# Patient Record
Sex: Female | Born: 1935 | Race: White | Hispanic: No | Smoking: Former smoker
Health system: Southern US, Community
[De-identification: ages and names within clinical notes are randomized; demographics above are authoritative.]

## PROBLEM LIST (undated history)

## (undated) DIAGNOSIS — I4891 Unspecified atrial fibrillation: Secondary | ICD-10-CM

## (undated) DIAGNOSIS — I714 Abdominal aortic aneurysm, without rupture, unspecified: Secondary | ICD-10-CM

## (undated) DIAGNOSIS — H409 Unspecified glaucoma: Secondary | ICD-10-CM

## (undated) DIAGNOSIS — N189 Chronic kidney disease, unspecified: Secondary | ICD-10-CM

## (undated) DIAGNOSIS — M1990 Unspecified osteoarthritis, unspecified site: Secondary | ICD-10-CM

## (undated) DIAGNOSIS — H3530 Unspecified macular degeneration: Secondary | ICD-10-CM

## (undated) DIAGNOSIS — E119 Type 2 diabetes mellitus without complications: Secondary | ICD-10-CM

## (undated) HISTORY — PX: ABDOMINAL AORTIC ANEURYSM REPAIR W/ ENDOLUMINAL GRAFT: SUR7

## (undated) HISTORY — PX: CATARACT EXTRACTION, BILATERAL: SHX1313

## (undated) HISTORY — PX: PLACEMENT OF BREAST IMPLANTS: SHX6334

## (undated) HISTORY — PX: MASTECTOMY: SHX3

## (undated) HISTORY — PX: PARS PLANA VITRECTOMY W/ REPAIR OF MACULAR HOLE: SHX2170

## (undated) HISTORY — PX: BREAST BIOPSY: SHX20

## (undated) HISTORY — PX: DILATION AND CURETTAGE OF UTERUS: SHX78

## (undated) HISTORY — PX: ABDOMINAL AORTA STENT: SHX1108

---

## 2015-01-05 ENCOUNTER — Encounter (HOSPITAL_COMMUNITY): Payer: Self-pay | Admitting: Emergency Medicine

## 2015-01-05 ENCOUNTER — Emergency Department (HOSPITAL_BASED_OUTPATIENT_CLINIC_OR_DEPARTMENT_OTHER): Admit: 2015-01-05 | Discharge: 2015-01-05 | Disposition: A | Discharge status: Home / Self Care | Payer: Medicare Other

## 2015-01-05 ENCOUNTER — Emergency Department (HOSPITAL_COMMUNITY): Payer: Medicare Other

## 2015-01-05 ENCOUNTER — Emergency Department (HOSPITAL_COMMUNITY)
Admission: EM | Admit: 2015-01-05 | Discharge: 2015-01-06 | Disposition: A | Discharge status: Skilled Nursing Facility | Payer: Medicare Other | Attending: Emergency Medicine | Admitting: Emergency Medicine

## 2015-01-05 ENCOUNTER — Other Ambulatory Visit: Payer: Self-pay

## 2015-01-05 DIAGNOSIS — M79609 Pain in unspecified limb: Secondary | ICD-10-CM

## 2015-01-05 DIAGNOSIS — E119 Type 2 diabetes mellitus without complications: Secondary | ICD-10-CM | POA: Insufficient documentation

## 2015-01-05 DIAGNOSIS — N189 Chronic kidney disease, unspecified: Secondary | ICD-10-CM | POA: Diagnosis not present

## 2015-01-05 DIAGNOSIS — I671 Cerebral aneurysm, nonruptured: Secondary | ICD-10-CM | POA: Diagnosis not present

## 2015-01-05 DIAGNOSIS — Z7901 Long term (current) use of anticoagulants: Secondary | ICD-10-CM | POA: Insufficient documentation

## 2015-01-05 DIAGNOSIS — Z8669 Personal history of other diseases of the nervous system and sense organs: Secondary | ICD-10-CM | POA: Diagnosis not present

## 2015-01-05 DIAGNOSIS — M25562 Pain in left knee: Secondary | ICD-10-CM | POA: Diagnosis present

## 2015-01-05 DIAGNOSIS — R531 Weakness: Secondary | ICD-10-CM | POA: Diagnosis not present

## 2015-01-05 DIAGNOSIS — Z79899 Other long term (current) drug therapy: Secondary | ICD-10-CM | POA: Diagnosis not present

## 2015-01-05 DIAGNOSIS — I4891 Unspecified atrial fibrillation: Secondary | ICD-10-CM | POA: Insufficient documentation

## 2015-01-05 HISTORY — DX: Unspecified atrial fibrillation: I48.91

## 2015-01-05 HISTORY — DX: Chronic kidney disease, unspecified: N18.9

## 2015-01-05 LAB — I-STAT TROPONIN, ED: Troponin i, poc: 0.02 ng/mL (ref 0.00–0.08)

## 2015-01-05 LAB — URINALYSIS, ROUTINE W REFLEX MICROSCOPIC
Bilirubin Urine: NEGATIVE
Glucose, UA: NEGATIVE mg/dL
Ketones, ur: NEGATIVE mg/dL
Nitrite: NEGATIVE
Protein, ur: NEGATIVE mg/dL
Specific Gravity, Urine: 1.008 (ref 1.005–1.030)
Urobilinogen, UA: 0.2 mg/dL (ref 0.0–1.0)
pH: 6 (ref 5.0–8.0)

## 2015-01-05 LAB — PROTIME-INR
INR: 1.3 (ref 0.00–1.49)
Prothrombin Time: 16.3 s — ABNORMAL HIGH (ref 11.6–15.2)

## 2015-01-05 LAB — COMPREHENSIVE METABOLIC PANEL WITH GFR
ALT: 15 U/L (ref 14–54)
AST: 26 U/L (ref 15–41)
Albumin: 3 g/dL — ABNORMAL LOW (ref 3.5–5.0)
Alkaline Phosphatase: 117 U/L (ref 38–126)
Anion gap: 8 (ref 5–15)
BUN: 29 mg/dL — ABNORMAL HIGH (ref 6–20)
CO2: 28 mmol/L (ref 22–32)
Calcium: 9 mg/dL (ref 8.9–10.3)
Chloride: 102 mmol/L (ref 101–111)
Creatinine, Ser: 1.05 mg/dL — ABNORMAL HIGH (ref 0.44–1.00)
GFR calc Af Amer: 52 mL/min — ABNORMAL LOW
GFR calc non Af Amer: 45 mL/min — ABNORMAL LOW
Glucose, Bld: 107 mg/dL — ABNORMAL HIGH (ref 65–99)
Potassium: 4 mmol/L (ref 3.5–5.1)
Sodium: 138 mmol/L (ref 135–145)
Total Bilirubin: 0.7 mg/dL (ref 0.3–1.2)
Total Protein: 6.8 g/dL (ref 6.5–8.1)

## 2015-01-05 LAB — DIFFERENTIAL
Basophils Absolute: 0 K/uL (ref 0.0–0.1)
Basophils Relative: 0 % (ref 0–1)
Eosinophils Absolute: 0.1 K/uL (ref 0.0–0.7)
Eosinophils Relative: 2 % (ref 0–5)
Lymphocytes Relative: 23 % (ref 12–46)
Lymphs Abs: 1.4 K/uL (ref 0.7–4.0)
Monocytes Absolute: 0.9 K/uL (ref 0.1–1.0)
Monocytes Relative: 15 % — ABNORMAL HIGH (ref 3–12)
Neutro Abs: 3.7 K/uL (ref 1.7–7.7)
Neutrophils Relative %: 60 % (ref 43–77)

## 2015-01-05 LAB — CBC
HCT: 44 % (ref 36.0–46.0)
Hemoglobin: 14.5 g/dL (ref 12.0–15.0)
MCH: 29.8 pg (ref 26.0–34.0)
MCHC: 33 g/dL (ref 30.0–36.0)
MCV: 90.3 fL (ref 78.0–100.0)
Platelets: 240 K/uL (ref 150–400)
RBC: 4.87 MIL/uL (ref 3.87–5.11)
RDW: 13.5 % (ref 11.5–15.5)
WBC: 6 K/uL (ref 4.0–10.5)

## 2015-01-05 LAB — RAPID URINE DRUG SCREEN, HOSP PERFORMED
Amphetamines: NOT DETECTED
Barbiturates: NOT DETECTED
Benzodiazepines: NOT DETECTED
Cocaine: NOT DETECTED
Opiates: NOT DETECTED
Tetrahydrocannabinol: NOT DETECTED

## 2015-01-05 LAB — ETHANOL: Alcohol, Ethyl (B): <5 mg/dL

## 2015-01-05 LAB — URINE MICROSCOPIC-ADD ON

## 2015-01-05 LAB — APTT: aPTT: 34 s (ref 24–37)

## 2015-01-05 NOTE — ED Provider Notes (Signed)
4:23 PM Patient seen in conjunction with [place] PA-C. Patient from nursing facility with concern for blood clot in L leg after having acute knee pain today during physical therapy. Also her legs have been more swollen than normal as of late. Patient has been refusing wraps and TED hose. She has diagnosis of afib and is on Xarelto. Patient currently does not have any complaints and is wondering why she is here. She is oriented and does not have history of dementia reported. On Lasix, unclear is she had h/o CHF -- denies SOB.   Plan: x-ray knee, LE doppler, ambulate.   Exam:  Gen NAD; Heart irregularly irregular, nml S1,S2, no m/r/g; Lungs CTAB; Abd soft, NT, no rebound or guarding; Ext 2+ pedal pulses bilaterally, 2+ symmetric non-pitting edema to ankles bilaterally; Neuro fully alert and oriented.  7:18 PM Patient discussed with and seen by Dr. Loretha Stapler. Advises work-up for TIA, admit for work-up.   Doppler and x-ray are negative. Patient remains asymptomatic.   10:33 PM CT shows large cerebral aneurysm. I discussed this with neurosurgery, Dr. Jeral Fruit. They advised that this does not require emergent intervention because it is not bleeding. Advised to evaluate carefully for other causes of ischemia and TIA.  I had a long discussion with the patient regarding these findings. She states that she's not really surprised by this because she had an aneurysm of her aorta. When asked if she'd be willing to be admitted to the hospital for consideration of repair, she states "what's the point? It's not like I would do anything for it anyway". She states that she has been waiting 'for the end' for awhile and is comfortable with her mortality. She is very mentally sharp and understands that if she does not have treatment for the aneurysm that she could die. She also states she could potentially be harmed by the treatment for the aneurysm. She states that she is aware that her transient trouble talking and weakness  today could be a warning of a stroke. We discussed the morbidity and mortality that he could be caused by a stroke. I told patient that she can return if she changes her mind.   I attempted to call patient's son Apolonia Ellwood who is POA. I got a voicemail and left a message.   Patient to be discharged. I encouraged her to see her PCP at the facilty this week for further discussion.   BP 133/92 mmHg  Pulse 106  Temp(Src) 97.6 F (36.4 C) (Oral)  Resp 19  SpO2 97%   Renne Crigler, PA-C 01/05/15 2338  Blake Divine, MD 01/08/15 [phone]

## 2015-01-05 NOTE — ED Provider Notes (Signed)
CSN: [id number]     Arrival date & time 01/05/15  1455 History   First MD Initiated Contact with Patient 01/05/15 1520     Chief Complaint  Patient presents with  . Leg Pain     (Consider location/radiation/quality/duration/timing/severity/associated sxs/prior Treatment) HPI Comments: Michele Lin is a 81 y/o F who was sent here from her nursing home for left knee pain upon ambulating with physical therapist this afternoon. According to the nursing home nurse (via telephone), the pt was attempting to use a walker in her physical therapy session when she was unable to move her left leg and complained of severe left knee pain. The physical therapist was concerned for a blood clot and sent her to the ER. The pt states that she did NOT feel any pain, but that she was unable to move her leg temporarily while ambulating. This is unusual for pt as she is normally able to have complete mobility of lower extremities. Pt does not see a reason for ER visit. Pt has lower extremity edema that has been present for over 40 years and is painless. Pt has been receiving LE compression wraps 3x/week and [facility] nursing home staff feels that the edema has worsened since pt has been receiving wraps. Pt has recent diagnosis of "erratic heart beat" and has been recently placed on xeralto. Pt denies history of any other chronic medical conditions. Denies history of known heart failure. Denies new onset SOB, difficulty of speech, pain in extremities, dysphagia, headache, or acute change in vision.  Of note, pt claims that about 2 weeks ago she experienced a transient loss in speech. This lasted only temporarily, for a few hours. Pt was concerned that she may have had a mini stroke. This occurred while pt was in nursing home, was never clinically adressed. Other than this reported incident, no known history of stroke.   Patient is a 81 y.o. female presenting with leg pain. The history is provided by the patient (nurse at  [facility] nursing home by phone).  Leg Pain Associated symptoms: no fatigue and no fever        Past Medical History  Diagnosis Date  . Chronic kidney disease (CKD)   . Atrial fibrillation   . Macular degeneration   . Diabetes mellitus without complication    History reviewed. No pertinent past surgical history. History reviewed. No pertinent family history. History  Substance Use Topics  . Smoking status: Never Smoker   . Smokeless tobacco: Not on file  . Alcohol Use: No   OB History    No data available     Review of Systems  Constitutional: Negative for fever and fatigue.  HENT: Negative for trouble swallowing.   Eyes: Negative for visual disturbance.  Respiratory: Negative for chest tightness, shortness of breath and wheezing.   Cardiovascular: Positive for leg swelling. Negative for chest pain and palpitations.  Musculoskeletal: Positive for gait problem. Negative for joint swelling and arthralgias.  Neurological: Negative for dizziness, speech difficulty, weakness, light-headedness and headaches.  Psychiatric/Behavioral: Negative for confusion.  All other systems reviewed and are negative.     Allergies  Review of patient's allergies indicates no known allergies.  Home Medications   Prior to Admission medications   Medication Sig Start Date End Date Taking? Authorizing Provider  acetaminophen (TYLENOL) 500 MG tablet Take 500 mg by mouth every 4 (four) hours as needed for mild pain, fever or headache.   Yes Historical Provider, MD  alum & mag hydroxide-simeth (GERI-LANTA) 200-200-20  MG/5ML suspension Take 30 mLs by mouth daily as needed for indigestion or heartburn.   Yes Historical Provider, MD  dorzolamide-timolol (COSOPT) 22.3-6.8 MG/ML ophthalmic solution Place 1 drop into the left eye at bedtime.   Yes Historical Provider, MD  furosemide (LASIX) 20 MG tablet Take 20 mg by mouth daily with breakfast.   Yes Historical Provider, MD  guaiFENesin (Q-TUSSIN) 100  MG/5ML liquid Take 200 mg by mouth every 6 (six) hours as needed for cough.   Yes Historical Provider, MD  loperamide (IMODIUM) 2 MG capsule Take 2 mg by mouth as needed for diarrhea or loose stools.   Yes Historical Provider, MD  loratadine (CLARITIN) 10 MG tablet Take 10 mg by mouth daily with breakfast.   Yes Historical Provider, MD  magnesium hydroxide (MILK OF MAGNESIA) 400 MG/5ML suspension Take 30 mLs by mouth at bedtime as needed for mild constipation.   Yes Historical Provider, MD  neomycin-bacitracin-polymyxin (NEOSPORIN) ointment Apply 1 application topically as needed for wound care.   Yes Historical Provider, MD  Nutritional Supplements (NUTRITIONAL SHAKE) LIQD Take 1 Bottle by mouth daily with breakfast. *Mighty Shake*   Yes Historical Provider, MD  polyethylene glycol powder (GLYCOLAX/MIRALAX) powder Take 1 Container by mouth at bedtime.   Yes Historical Provider, MD  potassium chloride SA (K-DUR,KLOR-CON) 20 MEQ tablet Take 10 mEq by mouth daily with breakfast.   Yes Historical Provider, MD  Rivaroxaban (XARELTO) 15 MG TABS tablet Take 15 mg by mouth at bedtime.   Yes Historical Provider, MD  trospium (SANCTURA) 20 MG tablet Take 20 mg by mouth every evening.   Yes Historical Provider, MD   BP 132/97 mmHg  Pulse 89  Temp(Src) 97.6 F (36.4 C) (Oral)  Resp 16  SpO2 100% Physical Exam  Constitutional: She is oriented to person, place, and time. She appears well-developed and well-nourished. No distress.  HENT:  Head: Normocephalic and atraumatic.  Eyes: Conjunctivae and EOM are normal. Pupils are equal, round, and reactive to light. No scleral icterus.  Cardiovascular: Intact distal pulses.  Exam reveals no gallop and no friction rub.   No murmur heard. Irregularly irregular rate and rhythm  Pulmonary/Chest: Effort normal and breath sounds normal. No respiratory distress. She has no wheezes. She has no rales. She exhibits no tenderness.  Abdominal: Soft. She exhibits no  distension.  Musculoskeletal: Normal range of motion. She exhibits edema and tenderness.  Non pitting edema  Neurological: She is alert and oriented to person, place, and time. No cranial nerve deficit.  Strength 5/5 in all 4 extremities. Decrease sensation in feet bilaterally.  Skin: Skin is warm and dry. She is not diaphoretic.  Psychiatric: She has a normal mood and affect. Her behavior is normal.    ED Course  Procedures (including critical care time)  Pt was sent here from nursing home, per nursing home nurse: during physical therapy session pt was unable to ambulate due to severe pain. Physical therapist was concerned for blood clot. Pt also has chronic lower extremity edema. Nursing home staff reports that edema had worsened. LE venous studies as well as knee xray were ordered, results were unremarkable. Negative for blood clot.   Pt states that she did NOT feel pain while ambulating, but that she was transiently unable to move her left leg and experienced brief weakness. Pt also reports that 2 weeks prior she experienced transient loss of speech.   TIA workup was initiated.   CT w/o contrast displayed a 1.8 x 1.1 cm right  MCA bifurcation cerebral aneurysm.   Pt given options for treatment including: clipping/coiling of aneurysm, consult with neurosurgeon. Pt is made aware of risks and benefits of both procedures and of the option to refuse treatment. Pt has chosen to not accept treatment and to follow up out patient with PCP. Pt has full mental capacity.    Labs Review Labs Reviewed  PROTIME-INR - Abnormal; Notable for the following:    Prothrombin Time 16.3 (*)    All other components within normal limits  DIFFERENTIAL - Abnormal; Notable for the following:    Monocytes Relative 15 (*)    All other components within normal limits  ETHANOL  APTT  CBC  COMPREHENSIVE METABOLIC PANEL  URINE RAPID DRUG SCREEN, HOSP PERFORMED  URINALYSIS, ROUTINE W REFLEX MICROSCOPIC (NOT AT  [facility])  I-STAT TROPOININ, ED    Imaging Review Ct Head Wo Contrast  01/05/2015   CLINICAL DATA:  81 year old female with sudden onset of leg pain during physical therapy, worse with weight-bearing.  EXAM: CT HEAD WITHOUT CONTRAST  TECHNIQUE: Contiguous axial images were obtained from the base of the skull through the vertex without intravenous contrast.  COMPARISON:  No priors.  FINDINGS: In the anterior aspect of the right temporal region there is a 1.8 x 1.1 cm high attenuation partially calcified extra-axial lesion which appears most concerning for a right MCA bifurcation aneurysm. This may alternatively represent an extra-axial lesion such as a partially calcified meningioma. Moderate cerebral and cerebellar atrophy. Well-defined foci of low attenuation in the cerebellar hemispheres bilaterally, compatible with old lacunar infarcts. Patchy and confluent areas of decreased attenuation are noted throughout the deep and periventricular white matter of the cerebral hemispheres bilaterally, compatible with chronic microvascular ischemic disease. No acute intracranial hemorrhage, and no hydrocephalus. No definite signs of acute or subacute cerebral ischemia. No acute displaced skull fractures. Visualized paranasal sinuses and mastoids are well pneumatized.  IMPRESSION: 1. No acute intracranial abnormalities. 2. However, there is a large 1.8 x 1.1 cm extra-axial high attenuation partially calcified lesion in the anterior aspect of the right temporal region, favored to represent a large aneurysm, likely of the right MCA bifurcation. Less likely, this might represent a meningioma. 3. Moderate cerebral and cerebellar atrophy with chronic microvascular ischemic changes in cerebral white matter, and old cerebellar lacunar infarcts. These results were called by telephone at the time of interpretation on 01/05/2015 at 7:55 pm to Dr. Gaylyn Rong, who verbally acknowledged these results.   Electronically Signed   By:  Trudie Reed M.D.   On: 01/05/2015 19:59   Dg Knee Complete 4 Views Left  01/05/2015   CLINICAL DATA:  81 year old female with edema in the left leg for the past 40 years, worsening over the past 2 weeks.  EXAM: LEFT KNEE - COMPLETE 4+ VIEW  COMPARISON:  No priors.  FINDINGS: No acute displaced fracture, subluxation or dislocation. There is extensive joint space narrowing, subchondral sclerosis, subchondral cyst formation and osteophyte formation in a tricompartmental distribution, most severe in the medial and patellofemoral compartments, compatible with osteoarthritis. Extensive atherosclerotic calcifications.  IMPRESSION: 1. No acute radiographic abnormality of the left knee. 2. Moderate degenerative changes of tricompartmental osteoarthritis, as above. 3. Atherosclerosis.   Electronically Signed   By: Trudie Reed M.D.   On: 01/05/2015 17:51     EKG Interpretation None      MDM   Final diagnoses:  Weakness  Cerebral aneurysm    Pt admitted for stroke workup given history of two episodes of  presumed transient neurological deficits. One episode occurring 2 weeks ago with reported transient loss of speech that lasted a few hours before returning. The second episode which occurred today prompting visit to ED, brief inability to move left leg during ambulation with physical therapist. This prompts concern for TIA. Pt is anticoagulated on xeralto for recent diagnosis of a.fib. However, pt does have extensive atherosclerosis.   Pt received CT head w/o contrast which showed a 1.1x1.8 cm right MCA bifurcation cerebral aneurysm.   Pt was updated and given options for treatment. Pt made choice to return home with no treatment and to follow up with PCP out patient.     Lester Kinsman McLendon-Chisholm, PA-C 01/06/15 0013  Blake Divine, MD 01/08/15 [phone]

## 2015-01-05 NOTE — Progress Notes (Signed)
CSW met with patient at bedside. There was no family present. Patient confirms that she comes from [facility]. Per note, patient presents to [facility] due to leg pain.  Patient states that she does not fall often. Patient states that she receives assistance with completing ADL's while at facility.  Patient informed CSW that she uses wheelchair to get around.   Patient states her primary support is her son who lives in [place]. She states that he visits her weekly.  Patient informed CSW that she does not have any questions at this time.  Willette Brace [phone] ED CSW 01/05/2015 5:17 PM

## 2015-01-05 NOTE — Progress Notes (Signed)
Left lower extremity venous duplex completed.  Left:  No evidence of DVT, superficial thrombosis, or Baker's cyst.  Right:  Negative for DVT in the common femoral vein.  

## 2015-01-05 NOTE — ED Notes (Signed)
Stoke screen completed,  Pt passed,

## 2015-01-05 NOTE — ED Notes (Signed)
Report received from [facility]  Pt is  Here for leg pain and reported NAD

## 2015-01-05 NOTE — ED Notes (Addendum)
Per EMS. Pt from [facility] nursing home. Pt reports leg edema for past "40 years" that staff report has gotten worse in past 2 weeks. Pt also had sudden onset of leg pain during physical therapy worsened with weight bearing. Normally walks with walker. Pt denies any leg pain. States leg swelling is the same as normal.

## 2015-01-05 NOTE — Discharge Instructions (Signed)
Cerebral Aneurysm An aneurysm is the bulging or ballooning out of part of the weakened wall of a vein or artery. An aneurysm in the vein or artery of the brain is called a brain aneurysm, or cerebral aneurysm.  Aneurysms are a risk to your health because they may leak or rupture. Once the aneurysm leaks or ruptures, bleeding occurs. If the bleeding occurs within the brain tissue, the condition is called an intracerebral hemorrhage. An intracerebral hemorrhage can result in a hemorrhagic stroke. If the bleeding occurs in the area between the brain and the thin tissues that cover the brain, the condition is called a subarachnoid hemorrhage. This increases the pressure on the brain and causes some areas of the brain to not get the necessary blood flow. The blood from the ruptured aneurysm collects and presses on the surrounding brain tissue. A subarachnoid hemorrhage can cause a stroke. A ruptured cerebral aneurysm is a medical emergency. This can cause permanent damage and loss of brain function. CAUSES A cerebral aneurysm is caused when a weakened part of the blood vessel expands. The blood vessel expands due to the constant pressure from the flow of blood through the weakened blood vessel. Usually the aneurysm expands slowly. As the weakened aneurysm expands, the walls of the aneurysm become weaker. Aneurysms may be associated with diseases that weaken and damage the walls of your blood vessels or blood vessels that develop abnormally. Some known causes for cerebral aneurysms are:  Head trauma.  Infection.  Use of "recreational drugs" such as cocaine or amphetamines. RISK FACTORS People at risk for a cerebral aneurysm or hemorrhagic stroke usually have one or more risk factors, which include:  Having high blood pressure (hypertension).  Abusing alcohol.  Having abnormal blood vessels present since birth.  Having certain bleeding disorders, such as hemophilia, sickle cell disease, or liver  disease.  Taking blood thinners (anticoagulants).  Smoking. SIGNS AND SYMPTOMS  The signs and symptoms of an unruptured cerebral aneurysm will partly depend on its size and rate of growth. A small, unchanging aneurysm generally does not produce symptoms. A larger aneurysm that is steadily growing can increase pressure on the brain or nerves. That increased pressure from the unruptured cerebral aneurysm can cause:  A headache.  Problems with your vision.  Numbness or weakness in an arm or leg.  Problems with memory.  Problems speaking.  Seizures. If an aneurysm leaks or bursts, it can cause a stroke and be life-threatening. Symptoms may include:  A sudden, severe headache with no known cause. The headache is often described as the worst headache ever experienced.  Nausea or vomiting, especially when combined with other symptoms such as a headache.  Sudden weakness or numbness of the face, arm, or leg, especially on one side of the body.  Sudden trouble walking or difficulty moving arms or legs.  Sudden confusion.  Sudden personality changes.  Trouble speaking (aphasia) or understanding.  Difficulty swallowing.  Sudden trouble seeing in one or both eyes.  Double vision.  Dizziness.  Loss of balance or coordination.  Intolerance to light.  Stiff neck. DIAGNOSIS  A CTA (computed tomographic angiography) may be performed to diagnose an aneurysm. A CTA uses dye and a CT scanner to take images of your blood vessels. An MRA (magnetic resonance angiography) may be used to diagnose an aneurysm. An MRA is performed in an MRI machine. While in the MRI machine, images of your blood vessels are taken. A cerebral aneurysm may also be diagnosed with a  cerebral angiogram. A cerebral angiogram requires a tube called a catheter to be inserted into a blood vessel and advanced to the blood vessels in your neck. Dye is then injected while X-ray images are taken to show the blood vessels in  your brain. TREATMENT  Unruptured Aneurysms Treatment is complex when an aneurysm is found and it is not causing problems. Treatment is very individualized, as each case is different. Many things must be considered, such as the size and exact location of your aneurysm, your age, your overall health, and your feelings and preferences. Small aneurysms in certain locations of the brain have a very low chance of bleeding or rupturing. These small aneurysms may not be treated. However, depending on the size and location of the aneurysm, treatments may be recommended and include:  Coiling. During this procedure, a catheter is inserted and advanced through a blood vessel. Once the catheter reaches the aneurysm, tiny coils are used to block blood flow into the aneurysm.  Surgical clipping. During surgery, a clip is placed at the base of the aneurysm. The clip prevents blood from continuing to enter the aneurysm. Ruptured Aneurysms Immediate emergency surgery may be needed to help prevent damage to the brain and to reduce the risk of rebleeding. Timing of treatment is an important factor in the prevention of complications. Successful early treatment of a ruptured aneurysm (within the first 3 days of a bleed) helps to prevent rebleeding and blood vessel spasm. In some cases, there may be a reason to treat later (10-14 days after a rupture). Many things are considered when making this decision, and each case is handled individually. HOME CARE INSTRUCTIONS  Take medicines only as instructed by your health care provider.  Eat healthy foods. It is recommended that you eat 5 or more servings of fruits and vegetables each day. Foods may need to be a special consistency (soft or pureed), or small bites may need to be taken if you have had a ruptured aneurysm or stroke. Certain dietary changes may be advised to address high blood pressure, high cholesterol, diabetes, or obesity.  Food choices that are low in salt  (sodium), saturated fat, trans fat, and cholesterol are recommended to manage high blood pressure.  Food choices that are high in fiber and low in saturated fat, trans fat, and cholesterol are recommended to control cholesterol levels.  Controlling carbohydrate and sugar intake is recommended to manage diabetes.  Reducing calorie intake and making food choices that are low in sodium, saturated fat, trans fat, and cholesterol are recommended to manage obesity.  Maintain a healthy weight.  Stay physically active. It is recommended that you get at least 30 minutes of activity on most or all days.  Do not smoke.  Limit alcohol use. Moderate alcohol use is considered to be:  No more than 2 drinks each day for men.  No more than 1 drink each day for nonpregnant women.  Stop drug abuse.  A safe home environment is important to reduce the risk of falls. Your health care provider may arrange for specialists to evaluate your home. Having grab bars in the bedroom and bathroom is often important. Your health care provider may arrange for special equipment to be used at home, such as raised toilets and a seat for the shower.  Physical, occupational, and speech therapy. Ongoing therapy may be needed to maximize your recovery after a ruptured aneurysm or stroke. If you have been advised to use a walker or a cane, use  it at all times. Be sure to keep your therapy appointments.  Follow all instructions for follow-up with your health care provider. This is very important. This includes any referrals, physical therapy, rehabilitation, and laboratory tests. Proper follow-up may prevent an aneurysm rupture or a stroke. SEEK IMMEDIATE MEDICAL CARE IF:  You have a sudden, severe headache with no known cause.  You have sudden nausea or vomiting with a severe headache.  You have sudden weakness or numbness of the face, arm, or leg, especially on one side of the body.  You have sudden trouble walking or  difficulty moving arms or legs.  You have sudden confusion.  You have trouble speaking or understanding.  You have sudden trouble seeing in one or both eyes.  You have a sudden loss of balance or coordination.  You have a stiff neck.  You have difficulty breathing.  You have a partial or total loss of consciousness. Any of these symptoms may represent a serious problem that is an emergency. Do not wait to see if the symptoms will go away. Get medical help at once. Call your local emergency services (911 in [place] Do not drive yourself to the hospital. Document Released: 02/05/2002 Document Revised: 09/30/2013 Document Reviewed: 11/01/2012 [facility] Patient Information 2015 [place], [place]. This information is not intended to replace advice given to you by your health care provider. Make sure you discuss any questions you have with your health care provider.   Follow up closely with PCP to further discuss cerebral aneurysm as well as TIA (mini stroke) symptoms.

## 2015-01-05 NOTE — ED Notes (Signed)
Gave report to [facility] med tech that pt needs to follow up with Bowen MD tomorrow

## 2015-01-06 NOTE — ED Notes (Signed)
[facility] here to pick patient up for transportation back to facility. Assisted with patient using the bedpan with no results. Pt tolerated it well.

## 2015-03-20 ENCOUNTER — Encounter (HOSPITAL_COMMUNITY): Payer: Self-pay

## 2015-03-20 ENCOUNTER — Inpatient Hospital Stay (HOSPITAL_COMMUNITY)
Admission: EM | Admit: 2015-03-20 | Discharge: 2015-03-23 | DRG: 300 | Disposition: A | Discharge status: Nursing Home (Includes ICF) | Payer: Medicare Other | Attending: Internal Medicine | Admitting: Internal Medicine

## 2015-03-20 ENCOUNTER — Emergency Department (HOSPITAL_COMMUNITY): Payer: Medicare Other

## 2015-03-20 DIAGNOSIS — Z87891 Personal history of nicotine dependence: Secondary | ICD-10-CM

## 2015-03-20 DIAGNOSIS — R1031 Right lower quadrant pain: Secondary | ICD-10-CM | POA: Diagnosis not present

## 2015-03-20 DIAGNOSIS — Z6824 Body mass index (BMI) 24.0-24.9, adult: Secondary | ICD-10-CM

## 2015-03-20 DIAGNOSIS — R109 Unspecified abdominal pain: Secondary | ICD-10-CM

## 2015-03-20 DIAGNOSIS — Z66 Do not resuscitate: Secondary | ICD-10-CM | POA: Diagnosis present

## 2015-03-20 DIAGNOSIS — E1122 Type 2 diabetes mellitus with diabetic chronic kidney disease: Secondary | ICD-10-CM | POA: Diagnosis present

## 2015-03-20 DIAGNOSIS — R10A1 Flank pain, right side: Secondary | ICD-10-CM | POA: Insufficient documentation

## 2015-03-20 DIAGNOSIS — R64 Cachexia: Secondary | ICD-10-CM | POA: Diagnosis present

## 2015-03-20 DIAGNOSIS — Z7902 Long term (current) use of antithrombotics/antiplatelets: Secondary | ICD-10-CM

## 2015-03-20 DIAGNOSIS — H3530 Unspecified macular degeneration: Secondary | ICD-10-CM | POA: Diagnosis present

## 2015-03-20 DIAGNOSIS — I714 Abdominal aortic aneurysm, without rupture, unspecified: Secondary | ICD-10-CM | POA: Diagnosis present

## 2015-03-20 DIAGNOSIS — M25551 Pain in right hip: Secondary | ICD-10-CM

## 2015-03-20 DIAGNOSIS — N189 Chronic kidney disease, unspecified: Secondary | ICD-10-CM | POA: Diagnosis present

## 2015-03-20 DIAGNOSIS — Z515 Encounter for palliative care: Secondary | ICD-10-CM | POA: Insufficient documentation

## 2015-03-20 DIAGNOSIS — I4891 Unspecified atrial fibrillation: Secondary | ICD-10-CM | POA: Diagnosis present

## 2015-03-20 DIAGNOSIS — Z79899 Other long term (current) drug therapy: Secondary | ICD-10-CM

## 2015-03-20 DIAGNOSIS — E119 Type 2 diabetes mellitus without complications: Secondary | ICD-10-CM

## 2015-03-20 HISTORY — DX: Type 2 diabetes mellitus without complications: E11.9

## 2015-03-20 HISTORY — DX: Abdominal aortic aneurysm, without rupture, unspecified: I71.40

## 2015-03-20 HISTORY — DX: Unspecified glaucoma: H40.9

## 2015-03-20 HISTORY — DX: Unspecified osteoarthritis, unspecified site: M19.90

## 2015-03-20 HISTORY — DX: Unspecified macular degeneration: H35.30

## 2015-03-20 HISTORY — DX: Abdominal aortic aneurysm, without rupture: I71.4

## 2015-03-20 LAB — URINALYSIS, ROUTINE W REFLEX MICROSCOPIC
Bilirubin Urine: NEGATIVE
GLUCOSE, UA: NEGATIVE mg/dL
Ketones, ur: NEGATIVE mg/dL
Nitrite: NEGATIVE
PROTEIN: NEGATIVE mg/dL
Specific Gravity, Urine: 1.011 (ref 1.005–1.030)
UROBILINOGEN UA: 1 mg/dL (ref 0.0–1.0)
pH: 7 (ref 5.0–8.0)

## 2015-03-20 LAB — CBC WITH DIFFERENTIAL/PLATELET
BASOS ABS: 0 10*3/uL (ref 0.0–0.1)
BASOS PCT: 0 %
Eosinophils Absolute: 0 10*3/uL (ref 0.0–0.7)
Eosinophils Relative: 1 %
HEMATOCRIT: 45.5 % (ref 36.0–46.0)
HEMOGLOBIN: 15 g/dL (ref 12.0–15.0)
Lymphocytes Relative: 20 %
Lymphs Abs: 1.5 10*3/uL (ref 0.7–4.0)
MCH: 30.1 pg (ref 26.0–34.0)
MCHC: 33 g/dL (ref 30.0–36.0)
MCV: 91.2 fL (ref 78.0–100.0)
Monocytes Absolute: 1.1 10*3/uL — ABNORMAL HIGH (ref 0.1–1.0)
Monocytes Relative: 15 %
NEUTROS ABS: 4.9 10*3/uL (ref 1.7–7.7)
NEUTROS PCT: 64 %
Platelets: 207 10*3/uL (ref 150–400)
RBC: 4.99 MIL/uL (ref 3.87–5.11)
RDW: 15 % (ref 11.5–15.5)
WBC: 7.6 10*3/uL (ref 4.0–10.5)

## 2015-03-20 LAB — COMPREHENSIVE METABOLIC PANEL
ALT: 21 U/L (ref 14–54)
ANION GAP: 8 (ref 5–15)
AST: 32 U/L (ref 15–41)
Albumin: 3.1 g/dL — ABNORMAL LOW (ref 3.5–5.0)
Alkaline Phosphatase: 119 U/L (ref 38–126)
BILIRUBIN TOTAL: 1.2 mg/dL (ref 0.3–1.2)
BUN: 28 mg/dL — ABNORMAL HIGH (ref 6–20)
CO2: 31 mmol/L (ref 22–32)
Calcium: 9.2 mg/dL (ref 8.9–10.3)
Chloride: 97 mmol/L — ABNORMAL LOW (ref 101–111)
Creatinine, Ser: 1.24 mg/dL — ABNORMAL HIGH (ref 0.44–1.00)
GFR calc non Af Amer: 37 mL/min — ABNORMAL LOW (ref >60)
GFR, EST AFRICAN AMERICAN: 42 mL/min — AB (ref >60)
Glucose, Bld: 122 mg/dL — ABNORMAL HIGH (ref 65–99)
Potassium: 4.2 mmol/L (ref 3.5–5.1)
SODIUM: 136 mmol/L (ref 135–145)
TOTAL PROTEIN: 6.6 g/dL (ref 6.5–8.1)

## 2015-03-20 LAB — URINE MICROSCOPIC-ADD ON

## 2015-03-20 MED ORDER — ONDANSETRON HCL 4 MG/2ML IJ SOLN
4.0000 mg | Freq: Once | INTRAMUSCULAR | Status: AC
  Administered 2015-03-20: 4 mg via INTRAVENOUS
  Filled 2015-03-20: qty 2

## 2015-03-20 MED ORDER — FENTANYL CITRATE (PF) 100 MCG/2ML IJ SOLN
25.0000 ug | Freq: Once | INTRAMUSCULAR | Status: AC
  Administered 2015-03-20: 25 ug via INTRAVENOUS
  Filled 2015-03-20: qty 2

## 2015-03-20 MED ORDER — SODIUM CHLORIDE 0.9 % IV BOLUS (SEPSIS)
500.0000 mL | Freq: Once | INTRAVENOUS | Status: AC
  Administered 2015-03-20: 500 mL via INTRAVENOUS

## 2015-03-20 NOTE — ED Provider Notes (Signed)
CSN: [id number]     Arrival date & time 03/20/15  1805 History   First MD Initiated Contact with Patient 03/20/15 1824     Chief Complaint  Patient presents with  . Groin Pain     (Consider location/radiation/quality/duration/timing/severity/associated sxs/prior Treatment) Patient is a 81 y.o. female presenting with general illness. The history is provided by the patient.  Illness Location:  R hip Quality:  Aching Severity:  Severe Onset quality:  Gradual Duration:  2 days Timing:  Intermittent Progression:  Waxing and waning Chronicity:  New Context:  Spontaneous, bed bound Relieved by:  Nothing tried Worsened by:  Movement Ineffective treatments:  Nothing tried Associated symptoms: abdominal pain   Associated symptoms: no chest pain, no diarrhea, no fever, no headaches, no loss of consciousness, no nausea, no rash, no shortness of breath and no vomiting   Abdominal pain:    Location:  Periumbilical   Quality:  Pressure and aching   Severity:  Moderate   Onset quality:  Gradual   Duration:  2 days   Timing:  Constant   Progression:  Worsening   Chronicity:  Recurrent Risk factors:  Hx of AAA, Prior surgery   Past Medical History  Diagnosis Date  . Chronic kidney disease (CKD)   . Atrial fibrillation (HCC)   . Glaucoma, left eye   . Macular degeneration, right eye   . Type II diabetes mellitus (HCC)   . Arthritis     "in my joints mostly; fingers getting more crookeder" (03/21/2015)  . AAA (abdominal aortic aneurysm) without rupture [facility]) hospitalized 03/20/2015   Past Surgical History  Procedure Laterality Date  . Abdominal aorta stent  ~2005  . Placement of breast implants Bilateral   . Breast biopsy Bilateral 1970's?  . Mastectomy Bilateral 1970's?    "subcutaneous"  . Cataract extraction, bilateral    . Pars plana vitrectomy w/ repair of macular hole Left   . Dilation and curettage of uterus    . Abdominal aortic aneurysm repair w/ endoluminal graft  ~  2006    Michele Lin 03/21/2015   History reviewed. No pertinent family history. Social History  Substance Use Topics  . Smoking status: Former Smoker -- 1.00 packs/day for 40 years    Types: Cigarettes  . Smokeless tobacco: None     Comment: "quit smoking in ~ 2005"  . Alcohol Use: Yes     Comment: 03/21/2015 "get a 6 pack of beer during football season"   OB History    No data available     Review of Systems  Constitutional: Negative for fever.  HENT: Negative for facial swelling.   Respiratory: Negative for shortness of breath.   Cardiovascular: Negative for chest pain.  Gastrointestinal: Positive for abdominal pain and abdominal distention. Negative for nausea, vomiting, diarrhea, constipation, blood in stool and rectal pain.  Genitourinary: Negative for dysuria.  Musculoskeletal: Negative for back pain.  Skin: Negative for rash.  Neurological: Negative for loss of consciousness and headaches.  Psychiatric/Behavioral: Negative for confusion.      Allergies  Review of patient's allergies indicates no known allergies.  Home Medications   Prior to Admission medications   Medication Sig Start Date End Date Taking? Authorizing Provider  acetaminophen (TYLENOL) 500 MG tablet Take 500 mg by mouth every 4 (four) hours as needed for mild pain, fever or headache.   Yes Historical Provider, MD  alum & mag hydroxide-simeth (GERI-LANTA) 200-200-20 MG/5ML suspension Take 30 mLs by mouth daily as needed for indigestion or  heartburn.   Yes Historical Provider, MD  amoxicillin (AMOXIL) 500 MG capsule Take 500 mg by mouth 3 (three) times daily.   Yes Historical Provider, MD  dorzolamide-timolol (COSOPT) 22.3-6.8 MG/ML ophthalmic solution Place 1 drop into the left eye at bedtime.   Yes Historical Provider, MD  furosemide (LASIX) 20 MG tablet Take 20 mg by mouth daily with breakfast.   Yes Historical Provider, MD  guaiFENesin (Q-TUSSIN) 100 MG/5ML liquid Take 200 mg by mouth every 6 (six)  hours as needed for cough.   Yes Historical Provider, MD  loperamide (IMODIUM) 2 MG capsule Take 2 mg by mouth as needed for diarrhea or loose stools.   Yes Historical Provider, MD  loratadine (CLARITIN) 10 MG tablet Take 10 mg by mouth daily with breakfast.   Yes Historical Provider, MD  magnesium hydroxide (MILK OF MAGNESIA) 400 MG/5ML suspension Take 30 mLs by mouth at bedtime as needed for mild constipation.   Yes Historical Provider, MD  neomycin-bacitracin-polymyxin (NEOSPORIN) ointment Apply 1 application topically as needed for wound care.   Yes Historical Provider, MD  Nutritional Supplements (NUTRITIONAL SHAKE) LIQD Take 1 Bottle by mouth daily with breakfast. *Mighty Shake*   Yes Historical Provider, MD  polyethylene glycol powder (GLYCOLAX/MIRALAX) powder Take 1 Container by mouth daily.    Yes Historical Provider, MD  potassium chloride SA (K-DUR,KLOR-CON) 20 MEQ tablet Take 10 mEq by mouth daily with breakfast.   Yes Historical Provider, MD  Rivaroxaban (XARELTO) 15 MG TABS tablet Take 15 mg by mouth at bedtime.   Yes Historical Provider, MD  trospium (SANCTURA) 20 MG tablet Take 20 mg by mouth every evening.   Yes Historical Provider, MD   BP 141/95 mmHg  Pulse 82  Temp(Src) 97.3 F (36.3 C) (Oral)  Resp 16  SpO2 97% Physical Exam  Constitutional: She is oriented to person, place, and time. She appears well-developed and well-nourished. No distress.  HENT:  Head: Normocephalic and atraumatic.  Right Ear: External ear normal.  Left Ear: External ear normal.  Nose: Nose normal.  Mouth/Throat: Oropharynx is clear and moist. No oropharyngeal exudate.  Eyes: Conjunctivae and EOM are normal. Pupils are equal, round, and reactive to light. Right eye exhibits no discharge. Left eye exhibits no discharge. No scleral icterus.  Neck: Normal range of motion. Neck supple. No JVD present. No tracheal deviation present. No thyromegaly present.  Cardiovascular: Normal rate, regular rhythm  and intact distal pulses.   Pulmonary/Chest: Effort normal and breath sounds normal. No stridor. No respiratory distress. She has no wheezes. She has no rales. She exhibits no tenderness.  Abdominal: Soft. She exhibits pulsatile midline mass and mass. She exhibits no distension, no fluid wave and no ascites. There is no tenderness.  Musculoskeletal: Normal range of motion. She exhibits no edema or tenderness.  Lymphadenopathy:    She has no cervical adenopathy.  Neurological: She is alert and oriented to person, place, and time.  Skin: Skin is warm and dry. No rash noted. She is not diaphoretic. No erythema. No pallor.  Psychiatric: She has a normal mood and affect. Her behavior is normal. Judgment and thought content normal.  Nursing note and vitals reviewed.   ED Course  Procedures (including critical care time) Labs Review Labs Reviewed  COMPREHENSIVE METABOLIC PANEL - Abnormal; Notable for the following:    Chloride 97 (*)    Glucose, Bld 122 (*)    BUN 28 (*)    Creatinine, Ser 1.24 (*)    Albumin 3.1 (*)  GFR calc non Af Amer 37 (*)    GFR calc Af Amer 42 (*)    All other components within normal limits  CBC WITH DIFFERENTIAL/PLATELET - Abnormal; Notable for the following:    Monocytes Absolute 1.1 (*)    All other components within normal limits  URINALYSIS, ROUTINE W REFLEX MICROSCOPIC (NOT AT [facility]) - Abnormal; Notable for the following:    APPearance HAZY (*)    Hgb urine dipstick SMALL (*)    Leukocytes, UA TRACE (*)    All other components within normal limits  URINE MICROSCOPIC-ADD ON - Abnormal; Notable for the following:    Squamous Epithelial / LPF FEW (*)    All other components within normal limits  GLUCOSE, CAPILLARY - Abnormal; Notable for the following:    Glucose-Capillary 103 (*)    All other components within normal limits  BASIC METABOLIC PANEL  CBC    Imaging Review Ct Renal Stone Study  03/20/2015  CLINICAL DATA:  Acute right groin pain. EXAM:  CT ABDOMEN AND PELVIS WITHOUT CONTRAST TECHNIQUE: Multidetector CT imaging of the abdomen and pelvis was performed following the standard protocol without IV contrast. COMPARISON:  None. FINDINGS: Visualized lung bases are unremarkable. No significant osseous abnormality is noted. No gallstones are noted. No focal abnormality is noted in the liver or spleen. Pancreas is not well visualized. Adrenal glands are not well visualized. Multiple large cysts are seen involving the kidneys. The largest measures 9 cm in diameter on the left. The patient has undergone stent graft repair of abdominal aortic aneurysm. However this is failed as the proximal end of the stent is well within the aneurysmal sac, which now has maximum measured diameter of 8.8 cm. There is no evidence of bowel obstruction. The appendix appears normal. No abnormal fluid collection is noted. Urinary bladder appears normal. Calcified uterine fibroid is noted. Sigmoid diverticulosis is noted without inflammation. 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 No significant adenopathy is noted. IMPRESSION: Large bilateral renal cysts are noted. Sigmoid diverticulosis is noted without inflammation. Failed stent graft procedure for treatment of abdominal aortic aneurysm. Aneurysmal sac is severely enlarged, with maximum measured diameter of 8.8 cm. Vascular surgery consultation recommended due to increased risk of rupture for AAA >5.5 cm. This recommendation follows ACR consensus guidelines: White Paper of the ACR Incidental Findings Committee II on Vascular Findings. J Am Coll Radiol 2013; 10:789-794. Critical Value/emergent results were called by telephone at the time of interpretation on 03/20/2015 at 8:12 pm to Dr. Gavin Pound , who verbally acknowledged these results. Electronically Signed   By: Lupita Raider, M.D.   On: 03/20/2015 20:13   Dg Femur, Min 2 Views Right  03/20/2015  CLINICAL DATA:  Right hip pain 2 days.  No injury.  EXAM: RIGHT FEMUR 2 VIEWS COMPARISON:  None. FINDINGS: There mild degenerate changes of the right hip. There is no acute fracture or dislocation. There is moderate calcified atherosclerotic disease involving iliac, femoral and popliteal arteries. IMPRESSION: No acute findings. Electronically Signed   By: Elberta Fortis M.D.   On: 03/20/2015 20:29   I have personally reviewed and evaluated these images and lab results as part of my medical decision-making.    MDM   Final diagnoses:  Right flank pain  Right hip pain  AAA (abdominal aortic aneurysm) without rupture [facility])    Patient has a history of prior AAA. She's had worsening swelling in her abdomen for 2 days with increased sensation  of a mass. Also some right hip pain, reproduced with palpation and internal/external rotation. Patient presentation concerning for worsening AAA. Laboratory workup and CT imaging obtained, along with plain films of the right femur.  CT imaging concerning for progressing aortic aneurysm. No obvious extravasation, but scan limited by lack of IV contrast due to renal insufficiency. Patient's pain improved in the emergency department with IV medications. Patient would not be a good surgical candidate due to her age and comorbidities. She has considered palliative management should she progress and develop pain or other symptoms associated with progressing aneurysm. Patient's son was contacted who is the patient's POA. He states that the aneurysm has been nearly this large for the last year. I explained that it did appear to have progressed significantly recently with the history provided by his mother. He will be at the hospital in the morning to discuss goals of care further but is in agreement with current level of care and focus on comfort management if needed. Patient's pain was well controlled with IV fentanyl.   Patient and family updated on plan care.  Patient was discussed with hospitalists for further management and  will be admitted.  Vascular surgery was consult a nonemergent basis for evaluation of the patient in the morning. As patient would likely not be an operative candidate, this is more for the purpose of prognostication and to provide additional information as needed for patient and family in planning goals of care.   Patient care was discussed with my attending, Dr. Rhunette Croft.    Gavin Pound, MD 03/21/15 1610  Derwood Kaplan, MD 03/23/15 [phone]

## 2015-03-20 NOTE — ED Notes (Signed)
EMS VS: BP-118/64, HR-64 afib (hx of afib), RR-16, O2-97% RA. CBG-198

## 2015-03-20 NOTE — ED Notes (Signed)
Pt placed on fracture bedpan due to pain in hip area. Call bell given for when pt is finished.

## 2015-03-20 NOTE — ED Notes (Signed)
PER EMS: pt from [facility], here for right side groin pain ongoing for about 24 hours. No difficulty urinating, no recent fall. Pt A&OX4. Denies any other symptoms.

## 2015-03-21 ENCOUNTER — Encounter (HOSPITAL_COMMUNITY): Payer: Self-pay | Admitting: General Practice

## 2015-03-21 DIAGNOSIS — R1030 Lower abdominal pain, unspecified: Secondary | ICD-10-CM | POA: Diagnosis present

## 2015-03-21 DIAGNOSIS — F0390 Unspecified dementia without behavioral disturbance: Secondary | ICD-10-CM | POA: Diagnosis not present

## 2015-03-21 DIAGNOSIS — H3530 Unspecified macular degeneration: Secondary | ICD-10-CM | POA: Diagnosis present

## 2015-03-21 DIAGNOSIS — E119 Type 2 diabetes mellitus without complications: Secondary | ICD-10-CM | POA: Diagnosis not present

## 2015-03-21 DIAGNOSIS — I714 Abdominal aortic aneurysm, without rupture, unspecified: Secondary | ICD-10-CM | POA: Insufficient documentation

## 2015-03-21 DIAGNOSIS — N183 Chronic kidney disease, stage 3 (moderate): Secondary | ICD-10-CM | POA: Diagnosis not present

## 2015-03-21 DIAGNOSIS — Z79899 Other long term (current) drug therapy: Secondary | ICD-10-CM | POA: Diagnosis not present

## 2015-03-21 DIAGNOSIS — I4891 Unspecified atrial fibrillation: Secondary | ICD-10-CM | POA: Diagnosis not present

## 2015-03-21 DIAGNOSIS — Z6824 Body mass index (BMI) 24.0-24.9, adult: Secondary | ICD-10-CM | POA: Diagnosis not present

## 2015-03-21 DIAGNOSIS — N189 Chronic kidney disease, unspecified: Secondary | ICD-10-CM | POA: Diagnosis not present

## 2015-03-21 DIAGNOSIS — Z66 Do not resuscitate: Secondary | ICD-10-CM | POA: Diagnosis present

## 2015-03-21 DIAGNOSIS — M25551 Pain in right hip: Secondary | ICD-10-CM

## 2015-03-21 DIAGNOSIS — Z515 Encounter for palliative care: Secondary | ICD-10-CM | POA: Diagnosis present

## 2015-03-21 DIAGNOSIS — R1031 Right lower quadrant pain: Secondary | ICD-10-CM | POA: Diagnosis present

## 2015-03-21 DIAGNOSIS — I480 Paroxysmal atrial fibrillation: Secondary | ICD-10-CM

## 2015-03-21 DIAGNOSIS — R109 Unspecified abdominal pain: Secondary | ICD-10-CM | POA: Diagnosis not present

## 2015-03-21 DIAGNOSIS — Z87891 Personal history of nicotine dependence: Secondary | ICD-10-CM | POA: Diagnosis not present

## 2015-03-21 DIAGNOSIS — Z7902 Long term (current) use of antithrombotics/antiplatelets: Secondary | ICD-10-CM | POA: Diagnosis not present

## 2015-03-21 DIAGNOSIS — M1990 Unspecified osteoarthritis, unspecified site: Secondary | ICD-10-CM | POA: Diagnosis not present

## 2015-03-21 DIAGNOSIS — E1122 Type 2 diabetes mellitus with diabetic chronic kidney disease: Secondary | ICD-10-CM | POA: Diagnosis present

## 2015-03-21 DIAGNOSIS — R64 Cachexia: Secondary | ICD-10-CM | POA: Diagnosis present

## 2015-03-21 DIAGNOSIS — H409 Unspecified glaucoma: Secondary | ICD-10-CM | POA: Diagnosis not present

## 2015-03-21 LAB — CBC
HEMATOCRIT: 44.3 % (ref 36.0–46.0)
HEMOGLOBIN: 14.7 g/dL (ref 12.0–15.0)
MCH: 30.4 pg (ref 26.0–34.0)
MCHC: 33.2 g/dL (ref 30.0–36.0)
MCV: 91.7 fL (ref 78.0–100.0)
Platelets: 165 10*3/uL (ref 150–400)
RBC: 4.83 MIL/uL (ref 3.87–5.11)
RDW: 15.1 % (ref 11.5–15.5)
WBC: 8.7 10*3/uL (ref 4.0–10.5)

## 2015-03-21 LAB — BASIC METABOLIC PANEL
ANION GAP: 13 (ref 5–15)
BUN: 28 mg/dL — ABNORMAL HIGH (ref 6–20)
CHLORIDE: 101 mmol/L (ref 101–111)
CO2: 27 mmol/L (ref 22–32)
CREATININE: 1.21 mg/dL — AB (ref 0.44–1.00)
Calcium: 9.3 mg/dL (ref 8.9–10.3)
GFR calc Af Amer: 44 mL/min — ABNORMAL LOW (ref >60)
GFR calc non Af Amer: 38 mL/min — ABNORMAL LOW (ref >60)
Glucose, Bld: 99 mg/dL (ref 65–99)
POTASSIUM: 4.3 mmol/L (ref 3.5–5.1)
Sodium: 141 mmol/L (ref 135–145)

## 2015-03-21 LAB — GLUCOSE, CAPILLARY
GLUCOSE-CAPILLARY: 93 mg/dL (ref 65–99)
GLUCOSE-CAPILLARY: 111 mg/dL — AB (ref 65–99)
GLUCOSE-CAPILLARY: 110 mg/dL — AB (ref 65–99)
GLUCOSE-CAPILLARY: 120 mg/dL — AB (ref 65–99)
Glucose-Capillary: 103 mg/dL — ABNORMAL HIGH (ref 65–99)

## 2015-03-21 LAB — MRSA PCR SCREENING: MRSA by PCR: POSITIVE — AB

## 2015-03-21 MED ORDER — INSULIN ASPART 100 UNIT/ML ~~LOC~~ SOLN
0.0000 [IU] | Freq: Three times a day (TID) | SUBCUTANEOUS | Status: DC
  Administered 2015-03-22: 2 [IU] via SUBCUTANEOUS

## 2015-03-21 MED ORDER — ACETAMINOPHEN 325 MG PO TABS: 650.0000 mg | ORAL_TABLET | Freq: Four times a day (QID) | ORAL | Status: DC | PRN

## 2015-03-21 MED ORDER — OXYCODONE HCL 5 MG PO TABS
5.0000 mg | ORAL_TABLET | ORAL | Status: DC | PRN
  Administered 2015-03-21: 5 mg via ORAL
  Filled 2015-03-21 (×2): qty 1

## 2015-03-21 MED ORDER — LORATADINE 10 MG PO TABS
10.0000 mg | ORAL_TABLET | Freq: Every day | ORAL | Status: DC
  Administered 2015-03-21 – 2015-03-23 (×3): 10 mg via ORAL
  Filled 2015-03-21 (×3): qty 1

## 2015-03-21 MED ORDER — SODIUM CHLORIDE 0.9 % IV SOLN
INTRAVENOUS | Status: DC
  Administered 2015-03-21 – 2015-03-22 (×3): via INTRAVENOUS

## 2015-03-21 MED ORDER — MUPIROCIN 2 % EX OINT
1.0000 "application " | TOPICAL_OINTMENT | Freq: Two times a day (BID) | CUTANEOUS | Status: DC
  Administered 2015-03-21 – 2015-03-23 (×5): 1 via NASAL
  Filled 2015-03-21 (×2): qty 22

## 2015-03-21 MED ORDER — ALUM & MAG HYDROXIDE-SIMETH 200-200-20 MG/5ML PO SUSP: 30.0000 mL | Freq: Four times a day (QID) | ORAL | Status: DC | PRN

## 2015-03-21 MED ORDER — ONDANSETRON HCL 4 MG/2ML IJ SOLN: 4.0000 mg | Freq: Four times a day (QID) | INTRAMUSCULAR | Status: DC | PRN

## 2015-03-21 MED ORDER — METOPROLOL TARTRATE 1 MG/ML IV SOLN
2.5000 mg | Freq: Four times a day (QID) | INTRAVENOUS | Status: DC
  Administered 2015-03-21 – 2015-03-23 (×9): 2.5 mg via INTRAVENOUS
  Filled 2015-03-21 (×9): qty 5

## 2015-03-21 MED ORDER — POTASSIUM CHLORIDE CRYS ER 10 MEQ PO TBCR
10.0000 meq | EXTENDED_RELEASE_TABLET | Freq: Every day | ORAL | Status: DC
  Administered 2015-03-21 – 2015-03-23 (×3): 10 meq via ORAL
  Filled 2015-03-21 (×3): qty 1

## 2015-03-21 MED ORDER — INSULIN ASPART 100 UNIT/ML ~~LOC~~ SOLN: 0.0000 [IU] | Freq: Every day | SUBCUTANEOUS | Status: DC

## 2015-03-21 MED ORDER — ACETAMINOPHEN 650 MG RE SUPP: 650.0000 mg | Freq: Four times a day (QID) | RECTAL | Status: DC | PRN

## 2015-03-21 MED ORDER — CHLORHEXIDINE GLUCONATE CLOTH 2 % EX PADS
6.0000 | MEDICATED_PAD | Freq: Every day | CUTANEOUS | Status: DC
  Administered 2015-03-21 – 2015-03-23 (×3): 6 via TOPICAL

## 2015-03-21 MED ORDER — HYDROMORPHONE HCL 1 MG/ML IJ SOLN: 0.5000 mg | INTRAMUSCULAR | Status: DC | PRN

## 2015-03-21 MED ORDER — AMOXICILLIN 500 MG PO CAPS
500.0000 mg | ORAL_CAPSULE | Freq: Three times a day (TID) | ORAL | Status: DC
  Administered 2015-03-21 – 2015-03-22 (×5): 500 mg via ORAL
  Filled 2015-03-21 (×8): qty 1

## 2015-03-21 MED ORDER — ENSURE ENLIVE PO LIQD
237.0000 mL | Freq: Every day | ORAL | Status: DC
  Administered 2015-03-21 – 2015-03-23 (×3): 237 mL via ORAL

## 2015-03-21 MED ORDER — POLYETHYLENE GLYCOL 3350 17 G PO PACK
17.0000 g | PACK | Freq: Every day | ORAL | Status: DC
  Administered 2015-03-22 – 2015-03-23 (×2): 17 g via ORAL
  Filled 2015-03-21 (×3): qty 1

## 2015-03-21 MED ORDER — ONDANSETRON HCL 4 MG PO TABS: 4.0000 mg | ORAL_TABLET | Freq: Four times a day (QID) | ORAL | Status: DC | PRN

## 2015-03-21 MED ORDER — DORZOLAMIDE HCL-TIMOLOL MAL 2-0.5 % OP SOLN
1.0000 [drp] | Freq: Every day | OPHTHALMIC | Status: DC
  Administered 2015-03-21 – 2015-03-22 (×3): 1 [drp] via OPHTHALMIC
  Filled 2015-03-21: qty 10

## 2015-03-21 MED ORDER — FUROSEMIDE 20 MG PO TABS
20.0000 mg | ORAL_TABLET | Freq: Every day | ORAL | Status: DC
  Administered 2015-03-21 – 2015-03-23 (×3): 20 mg via ORAL
  Filled 2015-03-21 (×3): qty 1

## 2015-03-21 MED ORDER — RIVAROXABAN 15 MG PO TABS
15.0000 mg | ORAL_TABLET | Freq: Every day | ORAL | Status: DC
  Administered 2015-03-21 – 2015-03-22 (×3): 15 mg via ORAL
  Filled 2015-03-21 (×3): qty 1

## 2015-03-21 MED ORDER — DARIFENACIN HYDROBROMIDE ER 7.5 MG PO TB24
7.5000 mg | ORAL_TABLET | Freq: Every day | ORAL | Status: DC
  Administered 2015-03-21 – 2015-03-23 (×3): 7.5 mg via ORAL
  Filled 2015-03-21 (×3): qty 1

## 2015-03-21 NOTE — Consult Note (Signed)
Consultation Note Date: 03/21/2015   Patient Name: Michele Lin  DOB: 21-Sep-1920  MRN: [id number]  Age / Sex: 81 y.o., female  PCP: Ron ParkerSamuel Bowen, MD Referring Physician: Joseph ArtJessica U Vann, DO  Reason for Consultation: Establishing goals of care    Clinical Assessment/Narrative: Pt is a 81 yo female admitted with severe right groin pain. Pt has a h/o AA with graft 10 years ago. After admission CT scan showed non seal of stent graft with enlarging AAA now greater than 8 cm. Pt is not a vascular surgical candidate. Pt is aware of dx, recognizes that she could have an acute event at any time that would end her life. She seems calm about this, " I'm ready". More worried about being a burden to her son. She has been currently residing in ALF, but does not view this as her home.   Contacts/Participants in Discussion: Primary Decision Maker: Pt in conjunction with her son Relationship to Patient son HCPOA: yes  Pt has 2 sons but one son [place] in [place] and [place]  Lives locally and has HCPOA  SUMMARY OF RECOMMENDATIONS DNR Open to hospice Hopeful for hospice in-patient but discussion with son pending for final disposition I do not feel that ALF level of care appropriate as rupture highly likely and pt will need emergency plan in place as well as medications which cannot be provided at that level  Code Status/Advance Care Planning: DNR    Code Status Orders        Start     Ordered   03/21/15 0100  Do not attempt resuscitation (DNR)   Continuous    Question Answer Comment  In the event of cardiac or respiratory ARREST Do not call a "code blue"   In the event of cardiac or respiratory ARREST Do not perform Intubation, CPR, defibrillation or ACLS   In the event of cardiac or respiratory ARREST Use medication by any route, position, wound care, and other measures to relive pain and suffering. May use  oxygen, suction and manual treatment of airway obstruction as needed for comfort.      03/21/15 0059      Other Directives:Advanced Directive  Symptom Management:   Pain: Cont with dilaudid 0.5-1mg  prn. Pt comfortable at rest but monitor for need for scheduled dosing. Insert foley catheter for comfort. DC PT  Palliative Prophylaxis:   Bowel regimen  Additional Recommendations (Limitations, Scope, Preferences):  Informally looking into whether pt meets in-pt hospice criteria as an option to discus with son and pt  Psycho-social/Spiritual:  Support System: Fair Desire for further Chaplaincy support:no Additional Recommendations: Caregiving  Support/Resources  Prognosis: Likely just weeks. Pt is verbalizing before acute pain , intermittent pain for months now associated with functional weakness in that she is now using a WC for mobility, DOE.   Discharge Planning: Pending. Feel that pt is open to hospice but  waiting to discuss specifics with son as well as whether pt may meet in-pt criteria. Feel strongly that this is the best place for pt since WHEN this ruptures she will need immediate palliative sedation as well as pain medications via injectable route   Chief Complaint/ Primary Diagnoses: Present on Admission:  . AAA (abdominal aortic aneurysm) without rupture (HCC) . Atrial fibrillation (HCC) . Chronic kidney disease (CKD) . Macular degeneration  I have reviewed the medical record, interviewed the patient and family, and examined the patient. The following aspects are pertinent.  Past Medical History  Diagnosis Date  . Chronic  kidney disease (CKD)   . Atrial fibrillation (HCC)   . Glaucoma, left eye   . Macular degeneration, right eye   . Type II diabetes mellitus (HCC)   . Arthritis     "in my joints mostly; fingers getting more crookeder" (03/21/2015)  . AAA (abdominal aortic aneurysm) without rupture [facility]) hospitalized 03/20/2015   Social History   Social  History  . Marital Status: Widowed    Spouse Name: N/A  . Number of Children: N/A  . Years of Education: N/A   Social History Main Topics  . Smoking status: Former Smoker -- 1.00 packs/day for 40 years    Types: Cigarettes  . Smokeless tobacco: None     Comment: "quit smoking in ~ 2005"  . Alcohol Use: Yes     Comment: 03/21/2015 "get a 6 pack of beer during football season"  . Drug Use: No  . Sexual Activity: No   Other Topics Concern  . None   Social History Narrative   History reviewed. No pertinent family history. Scheduled Meds: . amoxicillin  500 mg Oral TID  . Chlorhexidine Gluconate Cloth  6 each Topical Q0600  . darifenacin  7.5 mg Oral Daily  . dorzolamide-timolol  1 drop Left Eye QHS  . feeding supplement (ENSURE ENLIVE)  237 mL Oral Q breakfast  . furosemide  20 mg Oral Q breakfast  . insulin aspart  0-5 Units Subcutaneous QHS  . insulin aspart  0-9 Units Subcutaneous TID WC  . loratadine  10 mg Oral Q breakfast  . metoprolol  2.5 mg Intravenous 4 times per day  . mupirocin ointment  1 application Nasal BID  . polyethylene glycol  17 g Oral Daily  . potassium chloride SA  10 mEq Oral Q breakfast  . Rivaroxaban  15 mg Oral QHS   Continuous Infusions: . sodium chloride 50 mL/hr at 03/21/15 0100   PRN Meds:.acetaminophen **OR** acetaminophen, alum & mag hydroxide-simeth, HYDROmorphone (DILAUDID) injection, ondansetron **OR** ondansetron (ZOFRAN) IV, oxyCODONE Medications Prior to Admission:  Prior to Admission medications   Medication Sig Start Date End Date Taking? Authorizing Provider  acetaminophen (TYLENOL) 500 MG tablet Take 500 mg by mouth every 4 (four) hours as needed for mild pain, fever or headache.   Yes Historical Provider, MD  alum & mag hydroxide-simeth (GERI-LANTA) 200-200-20 MG/5ML suspension Take 30 mLs by mouth daily as needed for indigestion or heartburn.   Yes Historical Provider, MD  amoxicillin (AMOXIL) 500 MG capsule Take 500 mg by mouth  3 (three) times daily.   Yes Historical Provider, MD  dorzolamide-timolol (COSOPT) 22.3-6.8 MG/ML ophthalmic solution Place 1 drop into the left eye at bedtime.   Yes Historical Provider, MD  furosemide (LASIX) 20 MG tablet Take 20 mg by mouth daily with breakfast.   Yes Historical Provider, MD  guaiFENesin (Q-TUSSIN) 100 MG/5ML liquid Take 200 mg by mouth every 6 (six) hours as needed for cough.   Yes Historical Provider, MD  loperamide (IMODIUM) 2 MG capsule Take 2 mg by mouth as needed for diarrhea or loose stools.   Yes Historical Provider, MD  loratadine (CLARITIN) 10 MG tablet Take 10 mg by mouth daily with breakfast.   Yes Historical Provider, MD  magnesium hydroxide (MILK OF MAGNESIA) 400 MG/5ML suspension Take 30 mLs by mouth at bedtime as needed for mild constipation.   Yes Historical Provider, MD  neomycin-bacitracin-polymyxin (NEOSPORIN) ointment Apply 1 application topically as needed for wound care.   Yes Historical Provider, MD  Nutritional  Supplements (NUTRITIONAL SHAKE) LIQD Take 1 Bottle by mouth daily with breakfast. *Mighty Shake*   Yes Historical Provider, MD  polyethylene glycol powder (GLYCOLAX/MIRALAX) powder Take 1 Container by mouth daily.    Yes Historical Provider, MD  potassium chloride SA (K-DUR,KLOR-CON) 20 MEQ tablet Take 10 mEq by mouth daily with breakfast.   Yes Historical Provider, MD  Rivaroxaban (XARELTO) 15 MG TABS tablet Take 15 mg by mouth at bedtime.   Yes Historical Provider, MD  trospium (SANCTURA) 20 MG tablet Take 20 mg by mouth every evening.   Yes Historical Provider, MD   No Known Allergies CBC:    Component Value Date/Time   WBC 8.7 03/21/2015 0346   HGB 14.7 03/21/2015 0346   HCT 44.3 03/21/2015 0346   PLT 165 03/21/2015 0346   MCV 91.7 03/21/2015 0346   NEUTROABS 4.9 03/20/2015 1900   LYMPHSABS 1.5 03/20/2015 1900   MONOABS 1.1* 03/20/2015 1900   EOSABS 0.0 03/20/2015 1900   BASOSABS 0.0 03/20/2015 1900   Comprehensive Metabolic  Panel:    Component Value Date/Time   NA 141 03/21/2015 0346   K 4.3 03/21/2015 0346   CL 101 03/21/2015 0346   CO2 27 03/21/2015 0346   BUN 28* 03/21/2015 0346   CREATININE 1.21* 03/21/2015 0346   GLUCOSE 99 03/21/2015 0346   CALCIUM 9.3 03/21/2015 0346   AST 32 03/20/2015 1900   ALT 21 03/20/2015 1900   ALKPHOS 119 03/20/2015 1900   BILITOT 1.2 03/20/2015 1900   PROT 6.6 03/20/2015 1900   ALBUMIN 3.1* 03/20/2015 1900    Review of Systems  Constitutional: Positive for activity change and fatigue.  HENT: Positive for hearing loss.   Respiratory: Positive for shortness of breath.   Gastrointestinal: Positive for constipation.  Genitourinary: Positive for frequency.  Musculoskeletal: Positive for gait problem.  Neurological: Positive for weakness.    Physical Exam  Constitutional: She is oriented to person, place, and time. She appears well-developed.  HENT:  Head: Normocephalic.  Cardiovascular:  Palpable AAA, bounding  Respiratory: Effort normal.  GI: She exhibits distension.  Genitourinary:  Severe right groin pain  Neurological: She is alert and oriented to person, place, and time.  Skin: Skin is warm and dry.  Psychiatric: She has a normal mood and affect.    Vital Signs: BP 126/108 mmHg  Pulse 86  Temp(Src) 97.9 F (36.6 C) (Oral)  Resp 18  Ht 5\' 6"  (1.676 m)  Wt 67.8 kg (149 lb 7.6 oz)  BMI 24.14 kg/m2  SpO2 97% SpO2: Last BM Date: 03/20/15  O2 Device:SpO2: 97 % O2 Flow Rate: .  Intake/output summary:  Intake/Output Summary (Last 24 hours) at 03/21/15 1727 Last data filed at 03/21/15 1303  Gross per 24 hour  Intake   1444 ml  Output    600 ml  Net    844 ml   LBM:  BMP Latest Ref Rng 03/21/2015 03/20/2015 01/05/2015  Glucose 65 - 99 mg/dL 99 811(B) 147(W)  BUN 6 - 20 mg/dL 29(F) 62(Z) 30(Q)  Creatinine 0.44 - 1.00 mg/dL 6.57(Q) 4.69(G) 2.95(M)  Sodium 135 - 145 mmol/L 141 136 138  Potassium 3.5 - 5.1 mmol/L 4.3 4.2 4.0  Chloride 101 - 111  mmol/L 101 97(L) 102  CO2 22 - 32 mmol/L 27 31 28   Calcium 8.9 - 10.3 mg/dL 9.3 9.2 9.0    Baseline Weight: Weight: 67.8 kg (149 lb 7.6 oz) Most recent weight: Weight: 67.8 kg (149 lb 7.6 oz)  Palliative Assessment/Data:  Flowsheet Rows        Most Recent Value   Intake Tab    Referral Department  Hospitalist   Unit at Time of Referral  Cardiac/Telemetry Unit   Palliative Care Primary Diagnosis  Cardiac   Date Notified  03/20/15   Palliative Care Type  New Palliative care   Reason for referral  Clarify Goals of Care   Date of Admission  03/20/15   Date first seen by Palliative Care  03/21/15   # of days Palliative referral response time  1 Day(s)   # of days IP prior to Palliative referral  0   Clinical Assessment    Palliative Performance Scale Score  30%   Pain Max last 24 hours  8   Pain Min Last 24 hours  4   Dyspnea Max Last 24 Hours  1   Dyspnea Min Last 24 hours  1   Nausea Max Last 24 Hours  0   Nausea Min Last 24 Hours  0   Anxiety Max Last 24 Hours  0   Anxiety Min Last 24 Hours  0   Other Max Last 24 Hours  0   Psychosocial & Spiritual Assessment    Palliative Care Outcomes    Patient/Family meeting held?  No   Palliative Care Outcomes  Provided psychosocial or spiritual support, Counseled regarding hospice   Palliative Care follow-up planned  No   Other Treatment Preference Instructions  -- [hospice in pt]      Additional Data Reviewed: Recent Labs     03/20/15  1900  03/21/15  0346  WBC  7.6  8.7  HGB  15.0  14.7  PLT  207  165  NA  136  141  BUN  28*  28*  CREATININE  1.24*  1.21*    Time In: 1600 Time Out: 1710 Time Total: 70 min Greater than 50%  of this time was spent counseling and coordinating care related to the above assessment and plan.  Signed by: Irean Hong, NP  Irean Hong, NP  03/21/2015, 5:27 PM  Please contact Palliative Medicine Team phone at [phone] for questions and concerns.

## 2015-03-21 NOTE — H&P (Signed)
[facility] Admission History and Physical       Michele LordsMary Lin NWG:[id number] DOB: 19-Nov-1920 DOA: 03/20/2015  Referring physician: EDP PCP: Michele ParkerBOWEN,SAMUEL, MD  Specialists:   Chief Complaint: Right Groin Pain   HPI: Michele Lin is a 81 y.o. female with Atrial Fibrillation on Xarelto Rx, DM2, AAA, and CKD who presents to the ED with complaints of 10/10 Right Sided Groin pain which was abrupt in onset this evening.  She denies any history of falls, or trauma.   A CT scan was performed and revealed enlargement of her AAA and failure of the AAA Graft Repair which [facility].    The AAA now measures 8.8 cm in diameter.   Vascular Surgery was consulted to see her in the AM.   She is hemodynamically stable and her pain improved after administration of IV Fentanyl.  She was referred for medical admission.      Review of Systems:  Constitutional: No Weight Loss, No Weight Gain, Night Sweats, Fevers, Chills, Dizziness, Light Headedness, Fatigue, or Generalized Weakness HEENT: No Headaches, Difficulty Swallowing,Tooth/Dental Problems,Sore Throat,  No Sneezing, Rhinitis, Ear Ache, Nasal Congestion, or Post Nasal Drip,  Cardio-vascular:  No Chest pain, Orthopnea, PND, Edema in Lower Extremities, Anasarca, Dizziness, Palpitations  Resp: No Dyspnea, No DOE, No Productive Cough, No Non-Productive Cough, No Hemoptysis, No Wheezing.    GI: No Heartburn, Indigestion, +Abdominal Pain, Nausea, Vomiting, Diarrhea, Constipation, Hematemesis, Hematochezia, Melena, Change in Bowel Habits,  Loss of Appetite  GU: No Dysuria, No Change in Color of Urine, No Urgency or Urinary Frequency, No Flank pain.  Musculoskeletal: No Joint Pain or Swelling, No Decreased Range of Motion, No Back Pain.  Neurologic: No Syncope, No Seizures, Muscle Weakness, Paresthesia, Vision Disturbance or Loss, No Diplopia, No Vertigo, No Difficulty Walking,  Skin: No Rash or Lesions. Psych: No Change in Mood or  Affect, No Depression or Anxiety, No Memory loss, No Confusion, or Hallucinations   Past Medical History  Diagnosis Date  . Chronic kidney disease (CKD)   . Atrial fibrillation (HCC)   . Macular degeneration   . Diabetes mellitus without complication (HCC)      History reviewed. No pertinent past surgical history.    Prior to Admission medications   Medication Sig Start Date End Date Taking? Authorizing Provider  acetaminophen (TYLENOL) 500 MG tablet Take 500 mg by mouth every 4 (four) hours as needed for mild pain, fever or headache.   Yes Historical Provider, MD  alum & mag hydroxide-simeth (GERI-LANTA) 200-200-20 MG/5ML suspension Take 30 mLs by mouth daily as needed for indigestion or heartburn.   Yes Historical Provider, MD  amoxicillin (AMOXIL) 500 MG capsule Take 500 mg by mouth 3 (three) times daily.   Yes Historical Provider, MD  dorzolamide-timolol (COSOPT) 22.3-6.8 MG/ML ophthalmic solution Place 1 drop into the left eye at bedtime.   Yes Historical Provider, MD  furosemide (LASIX) 20 MG tablet Take 20 mg by mouth daily with breakfast.   Yes Historical Provider, MD  guaiFENesin (Q-TUSSIN) 100 MG/5ML liquid Take 200 mg by mouth every 6 (six) hours as needed for cough.   Yes Historical Provider, MD  loperamide (IMODIUM) 2 MG capsule Take 2 mg by mouth as needed for diarrhea or loose stools.   Yes Historical Provider, MD  loratadine (CLARITIN) 10 MG tablet Take 10 mg by mouth daily with breakfast.   Yes Historical Provider, MD  magnesium hydroxide (MILK OF MAGNESIA) 400 MG/5ML suspension Take 30 mLs by  mouth at bedtime as needed for mild constipation.   Yes Historical Provider, MD  neomycin-bacitracin-polymyxin (NEOSPORIN) ointment Apply 1 application topically as needed for wound care.   Yes Historical Provider, MD  Nutritional Supplements (NUTRITIONAL SHAKE) LIQD Take 1 Bottle by mouth daily with breakfast. *Mighty Shake*   Yes Historical Provider, MD  polyethylene glycol powder  (GLYCOLAX/MIRALAX) powder Take 1 Container by mouth daily.    Yes Historical Provider, MD  potassium chloride SA (K-DUR,KLOR-CON) 20 MEQ tablet Take 10 mEq by mouth daily with breakfast.   Yes Historical Provider, MD  Rivaroxaban (XARELTO) 15 MG TABS tablet Take 15 mg by mouth at bedtime.   Yes Historical Provider, MD  trospium (SANCTURA) 20 MG tablet Take 20 mg by mouth every evening.   Yes Historical Provider, MD     No Known Allergies    Social History:  [facility] at the [facility]  reports that she has never smoked. She does not have any smokeless tobacco history on file. She reports that she does not drink alcohol or use illicit drugs.     No family history on file.     Physical Exam:  GEN:  Pleasant Elderly Cachectic 81 y.o. Caucasian female examined and in no acute distress; cooperative with exam Filed Vitals:   03/20/15 2241 03/20/15 2245 03/20/15 2330 03/21/15 0000  BP: 136/101 125/80 112/81 105/87  Pulse: 100 86 60 66  Temp:      TempSrc:      Resp: SpO2: 94% 94% 94% 95%   Blood pressure 105/87, pulse 66, temperature 97.3 F (36.3 C), temperature source Oral, resp. rate 22, SpO2 95 %. PSYCH: She is alert and oriented x4; does not appear anxious does not appear depressed; affect is normal HEENT: Normocephalic and Atraumatic, Mucous membranes pink; PERRLA; EOM intact; Fundi:  Benign;  No scleral icterus, Nares: Patent, Oropharynx: Clear, Edentulous with Dentures,    Neck:  FROM, No Cervical Lymphadenopathy nor Thyromegaly or Carotid Bruit; No JVD; Breasts:: Not examined CHEST WALL: No tenderness CHEST: Normal respiration, clear to auscultation bilaterally HEART: Regular rate and rhythm; no murmurs rubs or gallops BACK: No kyphosis or scoliosis; No CVA tenderness ABDOMEN: Positive Bowel Sounds, Scaphoid, Soft Non-Tender, No Rebound or Guarding; No Masses, No Organomegaly Rectal Exam: Not done EXTREMITIES: No Cyanosis, Clubbing, or  Edema; No Ulcerations. Genitalia: not examined PULSES: 2+ and symmetric SKIN: Normal hydration no rash or ulceration CNS:  Alert and Oriented x 4, No Focal Deficits Vascular: pulses palpable throughout    Labs on Admission:  Basic Metabolic Panel:  Recent Labs Lab 03/20/15 1900  NA 136  K 4.2  CL 97*  CO2 31  GLUCOSE 122*  BUN 28*  CREATININE 1.24*  CALCIUM 9.2   Liver Function Tests:  Recent Labs Lab 03/20/15 1900  AST 32  ALT 21  ALKPHOS 119  BILITOT 1.2  PROT 6.6  ALBUMIN 3.1*   No results for input(s): LIPASE, AMYLASE in the last 168 hours. No results for input(s): AMMONIA in the last 168 hours. CBC:  Recent Labs Lab 03/20/15 1900  WBC 7.6  NEUTROABS 4.9  HGB 15.0  HCT 45.5  MCV 91.2  PLT 207   Cardiac Enzymes: No results for input(s): CKTOTAL, CKMB, CKMBINDEX, TROPONINI in the last 168 hours.  BNP (last 3 results) No results for input(s): BNP in the last 8760 hours.  ProBNP (last 3 results) No results for input(s): PROBNP in the last 8760 hours.  CBG: No results for input(s): GLUCAP in the last 168 hours.  Radiological Exams on Admission: Ct Renal Stone Study  03/20/2015  CLINICAL DATA:  Acute right groin pain. EXAM: CT ABDOMEN AND PELVIS WITHOUT CONTRAST TECHNIQUE: Multidetector CT imaging of the abdomen and pelvis was performed following the standard protocol without IV contrast. COMPARISON:  None. FINDINGS: Visualized lung bases are unremarkable. No significant osseous abnormality is noted. No gallstones are noted. No focal abnormality is noted in the liver or spleen. Pancreas is not well visualized. Adrenal glands are not well visualized. Multiple large cysts are seen involving the kidneys. The largest measures 9 cm in diameter on the left. The patient has undergone stent graft repair of abdominal aortic aneurysm. However this is failed as the proximal end of the stent is well within the aneurysmal sac, which now has maximum measured diameter  of 8.8 cm. There is no evidence of bowel obstruction. The appendix appears normal. No abnormal fluid collection is noted. Urinary bladder appears normal. Calcified uterine fibroid is noted. Sigmoid diverticulosis is noted without inflammation. 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 No significant adenopathy is noted. IMPRESSION: Large bilateral renal cysts are noted. Sigmoid diverticulosis is noted without inflammation. Failed stent graft procedure for treatment of abdominal aortic aneurysm. Aneurysmal sac is severely enlarged, with maximum measured diameter of 8.8 cm. Vascular surgery consultation recommended due to increased risk of rupture for AAA >5.5 cm. This recommendation follows ACR consensus guidelines: White Paper of the ACR Incidental Findings Committee II on Vascular Findings. J Am Coll Radiol 2013; 10:789-794. Critical Value/emergent results were called by telephone at the time of interpretation on 03/20/2015 at 8:12 pm to Dr. Gavin Pound , who verbally acknowledged these results. Electronically Signed   By: Lupita Raider, M.D.   On: 03/20/2015 20:13   Dg Femur, Min 2 Views Right  03/20/2015  CLINICAL DATA:  Right hip pain 2 days.  No injury. EXAM: RIGHT FEMUR 2 VIEWS COMPARISON:  None. FINDINGS: There mild degenerate changes of the right hip. There is no acute fracture or dislocation. There is moderate calcified atherosclerotic disease involving iliac, femoral and popliteal arteries. IMPRESSION: No acute findings. Electronically Signed   By: Elberta Fortis M.D.   On: 03/20/2015 20:29     EKG: Independently reviewed.         Assessment/Plan:      81 y.o. female with  Principal Problem:   1.     AAA (abdominal aortic aneurysm) without rupture [facility])   Non-surgical   Vascular Surgery to see in AM    Active Problems:   2.     Atrial fibrillation (HCC)   On Xarelto Rx     3.    Diabetes mellitus without complication (HCC)   SSI coverage PRN   Check  HbA1C     4.    Chronic kidney disease (CKD)   Monitor BUN/Cr     5.    Macular degeneration   Continue Cosopt      6.    DVT Prophylaxis   On Xarelto Rx    Patient is not in Extremis at this time     Consider Discontinuation of Xarelto due to #1     7.    Other - Palliative Care Consultation in AM     Code Status:     DO NOT RESUSCITATE (DNR)      Family Communication:  No Family Present, EDP spoke with Son over Telephone  who will call if questions overnight, otherwise he will be here in the AM Disposition Plan:    Inpatient Status        Time spent:  69 Minutes      Michele Lin [facility] Pager [phone]   If 7AM -7PM Please Contact the Day Rounding Team MD for [facility]  If 7PM-7AM, Please Contact Night-Floor Coverage  [email or web address] Password [email or web address] 03/21/2015, 12:24 AM     ADDENDUM:   Patient was seen and examined on 03/21/2015

## 2015-03-21 NOTE — Evaluation (Signed)
Physical Therapy Evaluation Patient Details Name: Michele Lin MRN: [id number] DOB: 06-Jan-1921 Today's Date: 03/21/2015   History of Present Illness  Pt is a 81 y/o F who presents for evaluation of AAA after being admitted w/ groin pain.  CT showed non seal of stent graft and enlargement of aneurysm.  Pt not candidate for operation.  Pt's PMH includes a-fib, DM.    Clinical Impression  Pt admitted with above diagnosis. Pt currently with functional limitations due to the deficits listed below (see PT Problem List). Michele Lin required min +1>mod +2 assist for bed mobility and mod +2 assist for sit<>stand.  She was limited by anxiety and pain today.  [facility] but will need SNF level of care at d/c.  Pt will benefit from skilled PT to increase their independence and safety with mobility to allow discharge to the venue listed below.      Follow Up Recommendations SNF;Supervision/Assistance - 24 hour    Equipment Recommendations  Other (comment) (TBD)    Recommendations for Other Services       Precautions / Restrictions Precautions Precautions: Fall Precaution Comments: no strenuous acitivty advised as pt has enlarged aneurysm that is not to be operated on Restrictions Weight Bearing Restrictions: No      Mobility  Bed Mobility Overal bed mobility: Needs Assistance;+2 for physical assistance Bed Mobility: Supine to Sit;Sit to Supine     Supine to sit: Min assist;HOB elevated Sit to supine: Mod assist;+2 for physical assistance   General bed mobility comments: Min assist +1 managing Bil LEs to achieve sitting EOB.  Mod +2 assist to return to supine in bed managing Bil LEs and trunk posteriorly.  Transfers Overall transfer level: Needs assistance Equipment used: Rolling walker (2 wheeled) Transfers: Sit to/from Stand Sit to Stand: Mod assist;+2 physical assistance;+2 safety/equipment;From elevated surface         General  transfer comment: Sit<>stand x2 w/ mod +2 assist to power up to standing and to stabilize once standing. Cues for hand placement.  Both times pt c/o of muscle spasms in Bil LEs as well as fear of Bil knee buckling and becomes extremely anxious and decides to return to sitting EOB.  Ambulation/Gait                Stairs            Wheelchair Mobility    Modified Rankin (Stroke Patients Only)       Balance Overall balance assessment: Needs assistance Sitting-balance support: Bilateral upper extremity supported;Feet supported Sitting balance-Leahy Scale: Fair     Standing balance support: Bilateral upper extremity supported;During functional activity Standing balance-Leahy Scale: Zero                               Pertinent Vitals/Pain Pain Assessment: Faces Faces Pain Scale: Hurts worst Pain Location: Lt groin and muscle spasms in Bil LEs upon standing Pain Descriptors / Indicators: Spasm;Sharp;Grimacing;Guarding;Moaning Pain Intervention(s): Limited activity within patient's tolerance;Monitored during session;Repositioned    Home Living Family/patient expects to be discharged to:: Unsure ("they are talking about taking me to [facility]")                 Additional Comments: Pt is from [facility] but will need SNF level of care at d/c.    Prior Function Level of Independence: Needs assistance   Gait / Transfers Assistance  Needed: Per pt she was nonambulatory for the past 1-2 months 2/2 generalized weakness and has been using her WC only which she operates w/ Bil LEs.  ADL's / Homemaking Assistance Needed: Needs assist w/ cleaning, cooking, bathing, dressing        Hand Dominance        Extremity/Trunk Assessment   Upper Extremity Assessment: Generalized weakness           Lower Extremity Assessment: Generalized weakness         Communication   Communication: HOH  Cognition Arousal/Alertness:  Awake/alert Behavior During Therapy: Anxious Overall Cognitive Status: No family/caregiver present to determine baseline cognitive functioning                      General Comments      Exercises        Assessment/Plan    PT Assessment Patient needs continued PT services  PT Diagnosis Difficulty walking;Abnormality of gait;Acute pain;Generalized weakness   PT Problem List Decreased strength;Decreased range of motion;Decreased activity tolerance;Decreased balance;Decreased mobility;Decreased coordination;Decreased cognition;Decreased knowledge of use of DME;Decreased safety awareness;Decreased knowledge of precautions;Pain;Cardiopulmonary status limiting activity  PT Treatment Interventions DME instruction;Gait training;Functional mobility training;Therapeutic activities;Therapeutic exercise;Balance training;Neuromuscular re-education;Cognitive remediation;Patient/family education;Modalities;Wheelchair mobility training   PT Goals (Current goals can be found in the Care Plan section) Acute Rehab PT Goals Patient Stated Goal: to feel better PT Goal Formulation: With patient Time For Goal Achievement: 04/11/15 Potential to Achieve Goals: Fair    Frequency Min 2X/week   Barriers to discharge Decreased caregiver support Pt will not have 24/7 assist upon d/c    Co-evaluation               End of Session Equipment Utilized During Treatment: Gait belt Activity Tolerance: Other (comment);Patient limited by fatigue;Patient limited by pain (limited by pt's anxiety w/ sit<>stand) Patient left: in bed;with call bell/phone within reach;with bed alarm set Nurse Communication: Mobility status;Precautions (pt's report of muscle spasms in quads)         Time: 1345-1406 PT Time Calculation (min) (ACUTE ONLY): 21 min   Charges:   PT Evaluation $Initial PT Evaluation Tier I: 1 Procedure     PT G CodesMichail Jewels PT, DPT [phone] Pager: [phone] 03/21/2015,  3:51 PM

## 2015-03-21 NOTE — Progress Notes (Signed)
Patient admitted after midnight, please H&P.  Await vascular surgery consult.  Pain better contolled  Marlin CanaryJessica Lida Berkery DO

## 2015-03-21 NOTE — Consult Note (Signed)
[facility] [place] HISTORY AND PHYSICAL   History of Present Illness:  Patient is a 81 y.o. year old female who presents for evaluation of AAA.  Pt had endovascular repair of AAA in [facility] by Dr Sherrell Puller approximately 10 years ago.  She was admitted to hospital last night for groin pain.  She had a CT which showed non seal of stent graft and enlargement of aneurysm.  Pt currently has no abdominal pain or back pain.  Other medical problems include afib, diabetes, arthritis all of which are stable.  Pt has not been seen in [facility] for "years".  Past Medical History  Diagnosis Date  . Chronic kidney disease (CKD)   . Atrial fibrillation (HCC)   . Glaucoma, left eye   . Macular degeneration, right eye   . Type II diabetes mellitus (HCC)   . Arthritis     "in my joints mostly; fingers getting more crookeder" (03/21/2015)  . AAA (abdominal aortic aneurysm) without rupture [facility]) hospitalized 03/20/2015    Past Surgical History  Procedure Laterality Date  . Abdominal aorta stent  ~2005  . Placement of breast implants Bilateral   . Breast biopsy Bilateral 1970's?  . Mastectomy Bilateral 1970's?    "subcutaneous"  . Cataract extraction, bilateral    . Pars plana vitrectomy w/ repair of macular hole Left   . Dilation and curettage of uterus    . Abdominal aortic aneurysm repair w/ endoluminal graft  ~ 2006    Hattie Perch 03/21/2015    Social History Social History  Substance Use Topics  . Smoking status: Former Smoker -- 1.00 packs/day for 40 years    Types: Cigarettes  . Smokeless tobacco: None     Comment: "quit smoking in ~ 2005"  . Alcohol Use: Yes     Comment: 03/21/2015 "get a 6 pack of beer during football season"    Family History History reviewed. No pertinent family history.  Allergies  No Known Allergies   Current Facility-Administered Medications  Medication Dose Route Frequency Provider Last Rate Last Dose  . 0.9 %  sodium chloride  infusion   Intravenous Continuous Ron Parker, MD 50 mL/hr at 03/21/15 0100    . acetaminophen (TYLENOL) tablet 650 mg  650 mg Oral Q6H PRN Ron Parker, MD       Or  . acetaminophen (TYLENOL) suppository 650 mg  650 mg Rectal Q6H PRN Ron Parker, MD      . alum & mag hydroxide-simeth (MAALOX/MYLANTA) 200-200-20 MG/5ML suspension 30 mL  30 mL Oral Q6H PRN Ron Parker, MD      . amoxicillin (AMOXIL) capsule 500 mg  500 mg Oral TID Ron Parker, MD   500 mg at 03/21/15 0248  . Chlorhexidine Gluconate Cloth 2 % PADS 6 each  6 each Topical Q0600 Joseph Art, DO      . darifenacin (ENABLEX) 24 hr tablet 7.5 mg  7.5 mg Oral Daily Harvette Velora Heckler, MD      . dorzolamide-timolol (COSOPT) 22.3-6.8 MG/ML ophthalmic solution 1 drop  1 drop Left Eye QHS Ron Parker, MD   1 drop at 03/21/15 0248  . feeding supplement (ENSURE ENLIVE) (ENSURE ENLIVE) liquid 237 mL  237 mL Oral Q breakfast Harvette C Jenkins, MD      . furosemide (LASIX) tablet 20 mg  20 mg Oral Q breakfast Harvette C Jenkins, MD      . HYDROmorphone (DILAUDID) injection 0.5-1 mg  0.5-1 mg Intravenous Q3H PRN Ron ParkerHarvette C Jenkins, MD      . insulin aspart (novoLOG) injection 0-5 Units  0-5 Units Subcutaneous QHS Ron ParkerHarvette C Jenkins, MD   0 Units at 03/21/15 0100  . insulin aspart (novoLOG) injection 0-9 Units  0-9 Units Subcutaneous TID WC Harvette Velora Heckler Jenkins, MD      . loratadine (CLARITIN) tablet 10 mg  10 mg Oral Q breakfast Ron ParkerHarvette C Jenkins, MD      . metoprolol (LOPRESSOR) injection 2.5 mg  2.5 mg Intravenous 4 times per day Ron ParkerHarvette C Jenkins, MD   2.5 mg at 03/21/15 0603  . mupirocin ointment (BACTROBAN) 2 % 1 application  1 application Nasal BID Jessica U Vann, DO      . ondansetron (ZOFRAN) tablet 4 mg  4 mg Oral Q6H PRN Ron ParkerHarvette C Jenkins, MD       Or  . ondansetron (ZOFRAN) injection 4 mg  4 mg Intravenous Q6H PRN Ron ParkerHarvette C Jenkins, MD      . oxyCODONE (Oxy IR/ROXICODONE) immediate release  tablet 5 mg  5 mg Oral Q4H PRN Harvette Velora Heckler Jenkins, MD      . polyethylene glycol (MIRALAX / GLYCOLAX) packet 17 g  17 g Oral Daily Harvette Velora Heckler Jenkins, MD      . potassium chloride (K-DUR,KLOR-CON) CR tablet 10 mEq  10 mEq Oral Q breakfast Ron ParkerHarvette C Jenkins, MD      . Rivaroxaban (XARELTO) tablet 15 mg  15 mg Oral QHS Ron ParkerHarvette C Jenkins, MD   15 mg at 03/21/15 0239    ROS:   General:  No weight loss, Fever, chills  HEENT: No recent headaches, no nasal bleeding, no visual changes, no sore throat  Neurologic: No dizziness, blackouts, seizures. No recent symptoms of stroke or mini- stroke. No recent episodes of slurred speech, or temporary blindness.  Cardiac: No recent episodes of chest pain/pressure, no shortness of breath at rest.  + shortness of breath with exertion.  Denies history of atrial fibrillation or irregular heartbeat  Vascular: No history of rest pain in feet.  No history of claudication.  No history of non-healing ulcer, No history of DVT   Pulmonary: No home oxygen, no productive cough, no hemoptysis,  No asthma or wheezing  Musculoskeletal:  [x ] Arthritis, [ ]  Low back pain,  [ ]  Joint pain  Hematologic:No history of hypercoagulable state.  No history of easy bleeding.  No history of anemia  Gastrointestinal: No hematochezia or melena,  No gastroesophageal reflux, no trouble swallowing  Urinary: [ ]  chronic Kidney disease, [ ]  on HD - [ ]  MWF or [ ]  TTHS, [ ]  Burning with urination, [ ]  Frequent urination, [ ]  Difficulty urinating;   Skin: No rashes  Psychological: No history of anxiety,  No history of depression   Physical Examination  Filed Vitals:   03/21/15 0000 03/21/15 0022 03/21/15 0524 03/21/15 0558  BP: 105/87 122/82 115/80 117/83  Pulse: 66 148 77 86  Temp:   97.9 F (36.6 C)   TempSrc:   Oral   Resp: 22 23 18    Height:   5\' 6"  (1.676 m)   Weight:   149 lb 7.6 oz (67.8 kg)   SpO2: 95%  98% 97%    Body mass index is 24.14 kg/(m^2).  General:   Alert and oriented, no acute distress HEENT: Normal Neck: No JVD Pulmonary: Clear to auscultation bilaterally Cardiac: Regular Rate and Rhythm  Abdomen: Soft, non-tender, non-distended, large pulsatile epigastric mass  Skin: No rash Extremity Pulses:  2+  Femoral bilaterally Musculoskeletal: No deformity or edema  Neurologic: Upper and lower extremity motor 5/5 and symmetric  DATA:  CT abdomen reviewed.  No proximal seal on aneurysm stent.  Enlarged neck and paravisceral aorta.  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  Pt not candidate for operation as this would not be fixable with stent graft and would require open thoracoabdominal aneurysm repair for which she is not a candidate due to her advanced age.   PLAN:  Risk of rupture > 50% per year.  No intervention or follow up planned.  Pt understands the aneurysm may rupture and lead to her death and is accepting of this.  Fabienne Bruns, MD [facility] [place] Office: [phone] Pager: [phone]

## 2015-03-21 NOTE — ED Notes (Signed)
Dr. Jenkins bedside. 

## 2015-03-22 DIAGNOSIS — I714 Abdominal aortic aneurysm, without rupture: Secondary | ICD-10-CM | POA: Diagnosis not present

## 2015-03-22 DIAGNOSIS — Z66 Do not resuscitate: Secondary | ICD-10-CM | POA: Diagnosis not present

## 2015-03-22 DIAGNOSIS — E119 Type 2 diabetes mellitus without complications: Secondary | ICD-10-CM

## 2015-03-22 DIAGNOSIS — H3530 Unspecified macular degeneration: Secondary | ICD-10-CM

## 2015-03-22 LAB — GLUCOSE, CAPILLARY
GLUCOSE-CAPILLARY: 100 mg/dL — AB (ref 65–99)
Glucose-Capillary: 84 mg/dL (ref 65–99)
Glucose-Capillary: 161 mg/dL — ABNORMAL HIGH (ref 65–99)
Glucose-Capillary: 101 mg/dL — ABNORMAL HIGH (ref 65–99)

## 2015-03-22 NOTE — Progress Notes (Addendum)
PROGRESS NOTE  Michele Lin ZOX:[id number] DOB: 1920/07/23 DOA: 03/20/2015 PCP: Ron Parker, MD  Michele Lin is a 81 y.o. female with Atrial Fibrillation on Xarelto Rx, DM2, AAA, and CKD who presents to the ED with complaints of 10/10 Right Sided Groin pain which was abrupt in onset.   A CT scan was performed and revealed enlargement of her AAA and failure of the AAA Graft Repair which was done 10 years ago at [facility]. The AAA now measures 8.8 cm in diameter. No intervention by vascular, palliative care consult for dispo.   Assessment/Plan: AAA (abdominal aortic aneurysm) without rupture (HCC) Non-surgical Vascular Surgery: Risk of rupture > 50% per year. No intervention or follow up planned   Hospice consult  Atrial fibrillation (HCC) On Xarelto Rx  Diabetes mellitus without complication (HCC) SSI coverage PRN  Chronic kidney disease (CKD) Monitor BUN/Cr  Macular degeneration Continue Cosopt    Code Status: DNR Family Communication: patient Disposition Plan:    Consultants:  Vascular  palliative  Procedures:      HPI/Subjective: No SOB, no CP Does not feel like eating but can drink ensure  Objective: Filed Vitals:   03/22/15 0626  BP: 102/67  Pulse: 82  Temp: 98 F (36.7 C)  Resp:     Intake/Output Summary (Last 24 hours) at 03/22/15 0911 Last data filed at 03/22/15 0559  Gross per 24 hour  Intake   1322 ml  Output    900 ml  Net    422 ml   Filed Weights   03/21/15 0524  Weight: 67.8 kg (149 lb 7.6 oz)    Exam:   General:  Awake, NAD  Cardiovascular: rrr  Respiratory: clear  Abdomen: +BS, soft  Musculoskeletal: min edema with chronic skin changes  Data Reviewed: Basic Metabolic Panel:  Recent Labs Lab 03/20/15 1900 03/21/15 0346  NA 136 141  K 4.2 4.3  CL  97* 101  CO2 31 27  GLUCOSE 122* 99  BUN 28* 28*  CREATININE 1.24* 1.21*  CALCIUM 9.2 9.3   Liver Function Tests:  Recent Labs Lab 03/20/15 1900  AST 32  ALT 21  ALKPHOS 119  BILITOT 1.2  PROT 6.6  ALBUMIN 3.1*   No results for input(s): LIPASE, AMYLASE in the last 168 hours. No results for input(s): AMMONIA in the last 168 hours. CBC:  Recent Labs Lab 03/20/15 1900 03/21/15 0346  WBC 7.6 8.7  NEUTROABS 4.9  --   HGB 15.0 14.7  HCT 45.5 44.3  MCV 91.2 91.7  PLT 207 165   Cardiac Enzymes: No results for input(s): CKTOTAL, CKMB, CKMBINDEX, TROPONINI in the last 168 hours. BNP (last 3 results) No results for input(s): BNP in the last 8760 hours.  ProBNP (last 3 results) No results for input(s): PROBNP in the last 8760 hours.  CBG:  Recent Labs Lab 03/21/15 0800 03/21/15 1217 03/21/15 1708 03/21/15 2209 03/22/15 0757  GLUCAP 93 111* 110* 120* 84    Recent Results (from the past 240 hour(s))  MRSA PCR Screening     Status: Abnormal   Collection Time: 03/21/15  7:50 AM  Result Value Ref Range Status   MRSA by PCR POSITIVE (A) NEGATIVE Final    Comment:        The GeneXpert MRSA Assay (FDA approved for NASAL specimens only), is one component of a comprehensive MRSA colonization surveillance program. It is not intended to diagnose MRSA infection nor to guide or monitor treatment for MRSA infections. CRITICAL RESULT CALLED TO, READ  BACK BY AND VERIFIED WITH: Barnett Applebaum, RN ON 03/21/15 :31 BY K MATILAINEN      Studies: Ct Renal Stone Study  03/20/2015  CLINICAL DATA:  Acute right groin pain. EXAM: CT ABDOMEN AND PELVIS WITHOUT CONTRAST TECHNIQUE: Multidetector CT imaging of the abdomen and pelvis was performed following the standard protocol without IV contrast. COMPARISON:  None. FINDINGS: Visualized lung bases are unremarkable. No significant osseous abnormality is noted. No gallstones are noted. No focal abnormality is noted in the liver or  spleen. Pancreas is not well visualized. Adrenal glands are not well visualized. Multiple large cysts are seen involving the kidneys. The largest measures 9 cm in diameter on the left. The patient has undergone stent graft repair of abdominal aortic aneurysm. However this is failed as the proximal end of the stent is well within the aneurysmal sac, which now has maximum measured diameter of 8.8 cm. There is no evidence of bowel obstruction. The appendix appears normal. No abnormal fluid collection is noted. Urinary bladder appears normal. Calcified uterine fibroid is noted. Sigmoid diverticulosis is noted without inflammation. 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 No significant adenopathy is noted. IMPRESSION: Large bilateral renal cysts are noted. Sigmoid diverticulosis is noted without inflammation. Failed stent graft procedure for treatment of abdominal aortic aneurysm. Aneurysmal sac is severely enlarged, with maximum measured diameter of 8.8 cm. Vascular surgery consultation recommended due to increased risk of rupture for AAA >5.5 cm. This recommendation follows ACR consensus guidelines: White Paper of the ACR Incidental Findings Committee II on Vascular Findings. J Am Coll Radiol 2013; 10:789-794. Critical Value/emergent results were called by telephone at the time of interpretation on 03/20/2015 at 8:12 pm to Dr. Gavin Pound , who verbally acknowledged these results. Electronically Signed   By: Lupita Raider, M.D.   On: 03/20/2015 20:13   Dg Femur, Min 2 Views Right  03/20/2015  CLINICAL DATA:  Right hip pain 2 days.  No injury. EXAM: RIGHT FEMUR 2 VIEWS COMPARISON:  None. FINDINGS: There mild degenerate changes of the right hip. There is no acute fracture or dislocation. There is moderate calcified atherosclerotic disease involving iliac, femoral and popliteal arteries. IMPRESSION: No acute findings. Electronically Signed   By: Elberta Fortis M.D.   On: 03/20/2015 20:29     Scheduled Meds: . amoxicillin  500 mg Oral TID  . Chlorhexidine Gluconate Cloth  6 each Topical Q0600  . darifenacin  7.5 mg Oral Daily  . dorzolamide-timolol  1 drop Left Eye QHS  . feeding supplement (ENSURE ENLIVE)  237 mL Oral Q breakfast  . furosemide  20 mg Oral Q breakfast  . insulin aspart  0-5 Units Subcutaneous QHS  . insulin aspart  0-9 Units Subcutaneous TID WC  . loratadine  10 mg Oral Q breakfast  . metoprolol  2.5 mg Intravenous 4 times per day  . mupirocin ointment  1 application Nasal BID  . polyethylene glycol  17 g Oral Daily  . potassium chloride SA  10 mEq Oral Q breakfast  . Rivaroxaban  15 mg Oral QHS   Continuous Infusions: . sodium chloride 50 mL/hr at 03/21/15 2155   Antibiotics Given (last 72 hours)    Date/Time Action Medication Dose   03/21/15 0248 Given   amoxicillin (AMOXIL) capsule 500 mg 500 mg   03/21/15 1022 Given   amoxicillin (AMOXIL) capsule 500 mg 500 mg   03/21/15 1612 Given   amoxicillin (AMOXIL) capsule 500 mg 500 mg  03/21/15 2155 Given   amoxicillin (AMOXIL) capsule 500 mg 500 mg      Principal Problem:   AAA (abdominal aortic aneurysm) without rupture (HCC) Active Problems:   Atrial fibrillation (HCC)   Diabetes mellitus without complication (HCC)   Chronic kidney disease (CKD)   Macular degeneration   Right hip pain   Palliative care encounter    Time spent: 25 min    [facility] LLC[facility], JESSICA  [facility] Pager [phone]. If 7PM-7AM, please contact night-coverage at [email or web address], password [email or web address] 03/22/2015, 9:11 AM  LOS: 1 day

## 2015-03-23 ENCOUNTER — Emergency Department (HOSPITAL_COMMUNITY)
Admission: EM | Admit: 2015-03-23 | Discharge: 2015-03-24 | Disposition: A | Discharge status: Home / Self Care | Payer: Medicare Other | Attending: Emergency Medicine | Admitting: Emergency Medicine

## 2015-03-23 ENCOUNTER — Encounter (HOSPITAL_COMMUNITY): Payer: Self-pay

## 2015-03-23 ENCOUNTER — Other Ambulatory Visit: Payer: Self-pay

## 2015-03-23 DIAGNOSIS — F0390 Unspecified dementia without behavioral disturbance: Secondary | ICD-10-CM | POA: Insufficient documentation

## 2015-03-23 DIAGNOSIS — I714 Abdominal aortic aneurysm, without rupture, unspecified: Secondary | ICD-10-CM

## 2015-03-23 DIAGNOSIS — N189 Chronic kidney disease, unspecified: Secondary | ICD-10-CM | POA: Insufficient documentation

## 2015-03-23 DIAGNOSIS — N183 Chronic kidney disease, stage 3 (moderate): Secondary | ICD-10-CM

## 2015-03-23 DIAGNOSIS — Z66 Do not resuscitate: Secondary | ICD-10-CM | POA: Diagnosis not present

## 2015-03-23 DIAGNOSIS — I4891 Unspecified atrial fibrillation: Secondary | ICD-10-CM | POA: Insufficient documentation

## 2015-03-23 DIAGNOSIS — E119 Type 2 diabetes mellitus without complications: Secondary | ICD-10-CM | POA: Insufficient documentation

## 2015-03-23 DIAGNOSIS — M1990 Unspecified osteoarthritis, unspecified site: Secondary | ICD-10-CM | POA: Insufficient documentation

## 2015-03-23 DIAGNOSIS — R109 Unspecified abdominal pain: Secondary | ICD-10-CM | POA: Insufficient documentation

## 2015-03-23 DIAGNOSIS — Z515 Encounter for palliative care: Secondary | ICD-10-CM | POA: Diagnosis not present

## 2015-03-23 DIAGNOSIS — Z87891 Personal history of nicotine dependence: Secondary | ICD-10-CM | POA: Insufficient documentation

## 2015-03-23 DIAGNOSIS — H3530 Unspecified macular degeneration: Secondary | ICD-10-CM | POA: Diagnosis not present

## 2015-03-23 DIAGNOSIS — H409 Unspecified glaucoma: Secondary | ICD-10-CM | POA: Insufficient documentation

## 2015-03-23 DIAGNOSIS — Z79899 Other long term (current) drug therapy: Secondary | ICD-10-CM | POA: Insufficient documentation

## 2015-03-23 LAB — GLUCOSE, CAPILLARY
GLUCOSE-CAPILLARY: 105 mg/dL — AB (ref 65–99)
GLUCOSE-CAPILLARY: 128 mg/dL — AB (ref 65–99)
Glucose-Capillary: 183 mg/dL — ABNORMAL HIGH (ref 65–99)

## 2015-03-23 MED ORDER — OXYCODONE-ACETAMINOPHEN 5-325 MG PO TABS
1.0000 | ORAL_TABLET | ORAL | Status: DC | PRN
  Administered 2015-03-24: 1 via ORAL
  Filled 2015-03-23: qty 1

## 2015-03-23 MED ORDER — OXYCODONE HCL 5 MG PO TABS: 5.0000 mg | ORAL_TABLET | ORAL | Status: AC | PRN

## 2015-03-23 NOTE — Progress Notes (Addendum)
BP 87/55 HR 82. Made Dr Benjamine MolaVann aware.Pt denies feeling lightheaded or dizzy. Will continue to monitor pt. Bed remains in lowest position and call bell is within reach. Pt has no complaints at this time.  1042 Dr Benjamine MolaVann responded and d/c'd scheduled metoprolol. Continuing to monitor pt.

## 2015-03-23 NOTE — ED Notes (Signed)
Pt arrived via [facility] from 5w room 16 c/o 10/10 right hip and groin pain.  Pt was on the way to [facility] to meet hospice but [facility] felt the pain was so uncontrolled they brought her into ED.  [facility] reports pt has an enlarged AAA that has ruptured through the AAA graft that was placed 10 years ago.  Burna MortimerWanda from case management is involved.

## 2015-03-23 NOTE — Progress Notes (Addendum)
PROGRESS NOTE  Michele Lin ZOX:[id number] DOB: 03/13/1921 DOA: 03/20/2015 PCP: Ron Parker, MD  Michele Lin is a 81 y.o. female with Atrial Fibrillation on Xarelto Rx, DM2, AAA, and CKD who presents to the ED with complaints of 10/10 Right Sided Groin pain which was abrupt in onset.   A CT scan was performed and revealed enlargement of her AAA and failure of the AAA Graft Repair which was done 10 years ago at [facility]. The AAA now measures 8.8 cm in diameter. No intervention by vascular, palliative care consult for dispo.   Assessment/Plan: AAA (abdominal aortic aneurysm) without rupture (HCC) Non-surgical Vascular Surgery: Risk of rupture > 50% per year. No intervention or follow up planned   Hospice consult?  Palliative care following- awaiting to hear from son  Atrial fibrillation (HCC) On Xarelto Rx  Diabetes mellitus without complication (HCC) SSI coverage PRN  Chronic kidney disease (CKD) Monitor BUN/Cr  Macular degeneration Continue Cosopt    Code Status: DNR Family Communication: patient Disposition Plan: hospice vs SNF?-- son has been contacted but has not returned call yet   Consultants:  Vascular  palliative  Procedures:      HPI/Subjective: Resting, NAD  Objective: Filed Vitals:   03/23/15 0455  BP: 110/66  Pulse: 91  Temp: 98.6 F (37 C)  Resp: 18    Intake/Output Summary (Last 24 hours) at 03/23/15 0943 Last data filed at 03/23/15 0500  Gross per 24 hour  Intake    320 ml  Output   1225 ml  Net   -905 ml   Filed Weights   03/21/15 0524  Weight: 67.8 kg (149 lb 7.6 oz)    Exam:   General:   NAD  Cardiovascular: rrr  Respiratory: clear  Abdomen: +BS, soft  Musculoskeletal: min edema with chronic skin changes  Data Reviewed: Basic Metabolic Panel:  Recent  Labs Lab 03/20/15 1900 03/21/15 0346  NA 136 141  K 4.2 4.3  CL 97* 101  CO2 31 27  GLUCOSE 122* 99  BUN 28* 28*  CREATININE 1.24* 1.21*  CALCIUM 9.2 9.3   Liver Function Tests:  Recent Labs Lab 03/20/15 1900  AST 32  ALT 21  ALKPHOS 119  BILITOT 1.2  PROT 6.6  ALBUMIN 3.1*   No results for input(s): LIPASE, AMYLASE in the last 168 hours. No results for input(s): AMMONIA in the last 168 hours. CBC:  Recent Labs Lab 03/20/15 1900 03/21/15 0346  WBC 7.6 8.7  NEUTROABS 4.9  --   HGB 15.0 14.7  HCT 45.5 44.3  MCV 91.2 91.7  PLT 207 165   Cardiac Enzymes: No results for input(s): CKTOTAL, CKMB, CKMBINDEX, TROPONINI in the last 168 hours. BNP (last 3 results) No results for input(s): BNP in the last 8760 hours.  ProBNP (last 3 results) No results for input(s): PROBNP in the last 8760 hours.  CBG:  Recent Labs Lab 03/22/15 0757 03/22/15 1142 03/22/15 1649 03/22/15 2159 03/23/15 0721  GLUCAP 84 161* 100* 101* 105*    Recent Results (from the past 240 hour(s))  MRSA PCR Screening     Status: Abnormal   Collection Time: 03/21/15  7:50 AM  Result Value Ref Range Status   MRSA by PCR POSITIVE (A) NEGATIVE Final    Comment:        The GeneXpert MRSA Assay (FDA approved for NASAL specimens only), is one component of a comprehensive MRSA colonization surveillance program. It is not intended to diagnose MRSA infection nor to guide or monitor  treatment for MRSA infections. CRITICAL RESULT CALLED TO, READ BACK BY AND VERIFIED WITH: Barnett ApplebaumS JONES, RN ON 03/21/15 @09 :31 BY K MATILAINEN      Studies: No results found.  Scheduled Meds: . Chlorhexidine Gluconate Cloth  6 each Topical Q0600  . darifenacin  7.5 mg Oral Daily  . dorzolamide-timolol  1 drop Left Eye QHS  . feeding supplement (ENSURE ENLIVE)  237 mL Oral Q breakfast  . furosemide  20 mg Oral Q breakfast  . insulin aspart  0-5 Units Subcutaneous QHS  . insulin aspart  0-9 Units Subcutaneous TID  WC  . loratadine  10 mg Oral Q breakfast  . metoprolol  2.5 mg Intravenous 4 times per day  . mupirocin ointment  1 application Nasal BID  . polyethylene glycol  17 g Oral Daily  . potassium chloride SA  10 mEq Oral Q breakfast  . Rivaroxaban  15 mg Oral QHS   Continuous Infusions: . sodium chloride 50 mL/hr at 03/22/15 1646   Antibiotics Given (last 72 hours)    Date/Time Action Medication Dose   03/21/15 0248 Given   amoxicillin (AMOXIL) capsule 500 mg 500 mg   03/21/15 1022 Given   amoxicillin (AMOXIL) capsule 500 mg 500 mg   03/21/15 1612 Given   amoxicillin (AMOXIL) capsule 500 mg 500 mg   03/21/15 2155 Given   amoxicillin (AMOXIL) capsule 500 mg 500 mg   03/22/15 [id number] Given   amoxicillin (AMOXIL) capsule 500 mg 500 mg      Principal Problem:   AAA (abdominal aortic aneurysm) without rupture (HCC) Active Problems:   Atrial fibrillation (HCC)   Diabetes mellitus without complication (HCC)   Chronic kidney disease (CKD)   Macular degeneration   Right hip pain   Palliative care encounter    Time spent: 15 min    Kaiser Belluomini  [facility] Pager [phone]. If 7PM-7AM, please contact night-coverage at [email or web address], password [email or web address] 03/23/2015, 9:43 AM  LOS: 2 days

## 2015-03-23 NOTE — Care Management Note (Signed)
Case Management Note  Patient Details  Name: Michele Lin MRN: [id number] Date of Birth: 18-Jun-1920  Subjective/Objective:                 [facility]. Patient admitted with AAA non ruptured, and is not a candidate for surgery.   Action/Plan:  Spoke with son, POA, who is agreeable to have patient return to AL[facility]. Director [name], and [employer] [name] available for meeting with [name] and self. Per staff [facility] patient does not need DME at this time. Called referral into [name] [facility], she will call referral in and plan on seeing patient after she returns to ALF.  Patient changed to observation status, Code 44 paperwork reviewed with son, he verbalized understanding. Copy given to [name], and note placed in chart.  Expected Discharge Date:                  Expected Discharge Plan:  Assisted Living / Rest Home  In-House Referral:     Discharge planning Services  CM Consult  Post Acute Care Choice:  Hospice Choice offered to:  Adult Children  DME Arranged:    DME Agency:     HH Arranged:    HH Agency:     Status of Service:  Completed, signed off  Medicare Important Message Given:    Date Medicare IM Given:    Medicare IM give by:    Date Additional Medicare IM Given:    Additional Medicare Important Message give by:     If discussed at Long Length of Stay Meetings, dates discussed:    Additional Comments:  [name], RN 03/23/2015, 2:29 PM

## 2015-03-23 NOTE — ED Provider Notes (Signed)
CSN: [id number]     Arrival date & time 03/23/15  1957 History   First MD Initiated Contact with Patient 03/23/15 2116     Chief Complaint  Patient presents with  . Groin Pain     (Consider location/radiation/quality/duration/timing/severity/associated sxs/prior Treatment) HPI..... Level V caveat for mild dementia. Patient was discharged from the hospital today and [facility] way [facility] care[facility] when she reported abdominal pain.   PTAR then returned her to the emergency department. She is under hospice care for an inoperable abdominal aortic aneurysm [8.8 cm] diagnosed in 03/20/15.    Past Medical History  Diagnosis Date  . Chronic kidney disease (CKD)   . Atrial fibrillation (HCC)   . Glaucoma, left eye   . Macular degeneration, right eye   . Type II diabetes mellitus (HCC)   . Arthritis     "in my joints mostly; fingers getting more crookeder" (03/21/2015)  . AAA (abdominal aortic aneurysm) without rupture [facility](HCC) hospitalized 03/20/2015   Past Surgical History  Procedure Laterality Date  . Abdominal aorta stent  ~2005  . Placement of breast implants Bilateral   . Breast biopsy Bilateral 1970's?  . Mastectomy Bilateral 1970's?    "subcutaneous"  . Cataract extraction, bilateral    . Pars plana vitrectomy w/ repair of macular hole Left   . Dilation and curettage of uterus    . Abdominal aortic aneurysm repair w/ endoluminal graft  ~ 2006    Hattie Perch/notes 03/21/2015   History reviewed. No pertinent family history. Social History  Substance Use Topics  . Smoking status: Former Smoker -- 1.00 packs/day for 40 years    Types: Cigarettes  . Smokeless tobacco: None     Comment: "quit smoking in ~ 2005"  . Alcohol Use: Yes     Comment: 03/21/2015 "get a 6 pack of beer during football season"   OB History    No data available     Review of Systems  Reason unable to perform ROS: mild dementia.      Allergies  Review of patient's allergies indicates no known  allergies.  Home Medications   Prior to Admission medications   Medication Sig Start Date End Date Taking? Authorizing Provider  acetaminophen (TYLENOL) 500 MG tablet Take 500 mg by mouth every 4 (four) hours as needed for mild pain, fever or headache.   Yes Historical Provider, MD  alum & mag hydroxide-simeth (GERI-LANTA) 200-200-20 MG/5ML suspension Take 30 mLs by mouth daily as needed for indigestion or heartburn.   Yes Historical Provider, MD  dorzolamide-timolol (COSOPT) 22.3-6.8 MG/ML ophthalmic solution Place 1 drop into the left eye at bedtime.   Yes Historical Provider, MD  furosemide (LASIX) 20 MG tablet Take 20 mg by mouth daily.   Yes Historical Provider, MD  guaifenesin (ROBITUSSIN) 100 MG/5ML syrup Take 200 mg by mouth 3 (three) times daily as needed for cough.   Yes Historical Provider, MD  loperamide (IMODIUM) 2 MG capsule Take 2 mg by mouth as needed for diarrhea or loose stools.   Yes Historical Provider, MD  loratadine (CLARITIN) 10 MG tablet Take 10 mg by mouth daily with breakfast.   Yes Historical Provider, MD  magnesium hydroxide (MILK OF MAGNESIA) 400 MG/5ML suspension Take 30 mLs by mouth at bedtime as needed for mild constipation.   Yes Historical Provider, MD  neomycin-bacitracin-polymyxin (NEOSPORIN) ointment Apply 1 application topically as needed for wound care.   Yes Historical Provider, MD  Nutritional Supplements (NUTRITIONAL SHAKE) LIQD Take 1  Bottle by mouth daily with breakfast. *Mighty Shake*   Yes Historical Provider, MD  polyethylene glycol powder (GLYCOLAX/MIRALAX) powder Take 1 Container by mouth daily.    Yes Historical Provider, MD  potassium chloride SA (K-DUR,KLOR-CON) 20 MEQ tablet Take 10 mEq by mouth daily.   Yes Historical Provider, MD  Rivaroxaban (XARELTO) 15 MG TABS tablet Take 15 mg by mouth at bedtime.   Yes Historical Provider, MD  trospium (SANCTURA) 20 MG tablet Take 20 mg by mouth every evening.   Yes Historical Provider, MD  oxyCODONE  (OXY IR/ROXICODONE) 5 MG immediate release tablet Take 1 tablet (5 mg total) by mouth every 4 (four) hours as needed for moderate pain. Patient not taking: Reported on 03/23/2015 03/23/15   Jessica U Vann, DO   BP 125/80 mmHg  Resp 24 Physical Exam  Constitutional: She is oriented to person, place, and time.  frail  HENT:  Head: Normocephalic and atraumatic.  Eyes: Conjunctivae and EOM are normal. Pupils are equal, round, and reactive to light.  Neck: Normal range of motion. Neck supple.  Cardiovascular: Normal rate and regular rhythm.   Pulmonary/Chest: Effort normal and breath sounds normal.  Abdominal: Soft. Bowel sounds are normal.  Musculoskeletal: Normal range of motion.  Neurological: She is alert and oriented to person, place, and time.  Skin: Skin is warm and dry.  Psychiatric: She has a normal mood and affect. Her behavior is normal.  Nursing note and vitals reviewed.   ED Course  Procedures (including critical care time) Labs Review Labs Reviewed - No data to display  Imaging Review No results found. I have personally reviewed and evaluated these images and lab results as part of my medical decision-making.   EKG Interpretation None      MDM   Final diagnoses:  Abdominal aortic aneurysm (AAA) without rupture [facility])    Discussed care with Child psychotherapist. Patient will be kept overnight.  She cannot be admitted to the [facility] without her pain prescription in hand.    Donnetta Hutching, MD 03/23/15 [phone]

## 2015-03-23 NOTE — Progress Notes (Signed)
Placed call to son Loraine LericheMark on evening of 10/22 to discuss disposition to in-pt hospice. No return call as of this time. Palliative Medicine Team will continue to follow. Have informally discussed case with inpatinet hospice to see if she would meet admission criteria.Eduard Roux. Elfie Costanza, ANP Palliative Medicine Team

## 2015-03-23 NOTE — Progress Notes (Signed)
Daily Progress Note   Patient Name: Michele Lin       Date: 03/23/2015 DOB: [birth year]  Age: 81 y.o. MRN#: [id number] Attending Physician: Joseph ArtJessica U Vann, DO Primary Care Physician: Michele Lin,SAMUEL, Michele Lin Admit Date: 03/20/2015  Reason for Consultation/Follow-up: Establishing goals of care  Subjective:  patient awake alert oriented resting in bed, in no distress currently. States pain is well controlled.   Interval Events: Chart reviewed: Call placed and discussed with son Michele Lin HCPOA, he states that he was out of town for the weekend.  Michele Lin states that he has communicated with Dr Michele Lin [place]en when the patient was brought to the ED. He is awake of the aneurysm and the risk of impending rupture.  He states that [facility] was [id number] and proceeding with addition of Hospice services over there.  Discussed D/C back to facility with hospice versus inpatient hospice. The patient's son does not want too many transitions of care, he does not feel that the patient's current clinical condition warrants emergent transfer to inpatient hospice towards the end of this hospitalization.  Discussed with patient, she is [facility]. All she wants is for some one to give her pain medications when she is in pain from her aneurysm. She states, "I'm not a coward, but I cannot take this pain."  Brief life review performed: the patient's son states she has lived a wonderful life, she is ready to pass away.   Extensive conversations held with both patient and her son Michele Lin. Offered supportive care ad voiced understanding.   Call placed and notified Dr Marlin CanaryJessica Vann.  Length of Stay: 2 days  Current  Medications: Scheduled Meds:  . Chlorhexidine Gluconate Cloth  6 each Topical Q0600  . darifenacin  7.5 mg Oral Daily  . dorzolamide-timolol  1 drop Left Eye QHS  . feeding supplement (ENSURE ENLIVE)  237 mL Oral Q breakfast  . furosemide  20 mg Oral Q breakfast  . insulin aspart  0-5 Units Subcutaneous QHS  . insulin aspart  0-9 Units Subcutaneous TID WC  . loratadine  10 mg Oral Q breakfast  . mupirocin ointment  1 application Nasal BID  . polyethylene glycol  17 g Oral Daily  . potassium chloride SA  10 mEq Oral Q breakfast  . Rivaroxaban  15 mg Oral QHS    Continuous Infusions:    PRN Meds: acetaminophen **OR** acetaminophen, alum & mag hydroxide-simeth, ondansetron **OR** ondansetron (ZOFRAN) IV, oxyCODONE  Physical Exam: Physical Exam              Vital Signs: BP 87/55 mmHg  Pulse 82  Temp(Src) 98.6 F (37 C) (Oral)  Resp 18  Ht  (1.676 m)  Wt 67.8 kg (149 lb 7.6 oz)  BMI 24.14 kg/m2  SpO2 94% SpO2: SpO2: 94 % O2 Device: O2 Device: Not Delivered O2 Flow Rate:    Intake/output summary:   Intake/Output Summary (Last 24 hours) at 03/23/15 1123 Last data filed at 03/23/15 0500  Gross per 24 hour  Intake    320 ml  Output   1225 ml  Net   -905 ml   LBM:   Baseline Weight: Weight: 67.8 kg (149 lb 7.6 oz) Most recent weight: Weight: 67.8 kg (149 lb 7.6 oz)       Palliative Assessment/Data: Flowsheet Rows        Most Recent Value   Intake Tab    Referral Department  Hospitalist   Unit at Time of Referral  Cardiac/Telemetry Unit   Palliative Care Primary Diagnosis  Cardiac   Date Notified  03/20/15   Palliative Care Type  New Palliative care   Reason for referral  Clarify Goals of Care   Date of Admission  03/20/15   Date first seen by Palliative Care  03/21/15   # of days Palliative referral response time  1 Day(s)   # of days IP prior to Palliative referral  0   Clinical Assessment    Palliative Performance Scale Score  30%   Pain Max last 24  hours  8   Pain Min Last 24 hours  4   Dyspnea Max Last 24 Hours  1   Dyspnea Min Last 24 hours  1   Nausea Max Last 24 Hours  0   Nausea Min Last 24 Hours  0   Anxiety Max Last 24 Hours  0   Anxiety Min Last 24 Hours  0   Other Max Last 24 Hours  0   Psychosocial & Spiritual Assessment    Palliative Care Outcomes    Patient/Family meeting held?  No   Palliative Care Outcomes  Provided psychosocial or spiritual support, Counseled regarding hospice   Palliative Care follow-up planned  No   Other Treatment Preference Instructions  -- [hospice in pt]      Additional Data Reviewed: Recent Labs     03/20/15  1900  03/21/15  0346  WBC  7.6  8.7  HGB  15.0  14.7  PLT  207  165  NA  136  141  BUN  28*  28*  CREATININE  1.24*  1.21*     Problem List:  Patient Active Problem List   Diagnosis Date Noted  . AAA (abdominal aortic aneurysm) (HCC) 03/21/2015  . AAA (abdominal aortic aneurysm) without rupture (HCC) 03/21/2015  . Atrial fibrillation (HCC) 03/21/2015  . Diabetes mellitus without complication (HCC) 03/21/2015  . Chronic kidney disease (CKD) 03/21/2015  . Macular degeneration 03/21/2015  . Right hip pain   . Palliative care encounter      Palliative Care Assessment & Plan    1.Code Status:  DNR    Code Status Orders        Start  Ordered   03/21/15 0100  Do not attempt resuscitation (DNR)   Continuous    Question Answer Comment  In the event of cardiac or respiratory ARREST Do not call a "code blue"   In the event of cardiac or respiratory ARREST Do not perform Intubation, CPR, defibrillation or ACLS   In the event of cardiac or respiratory ARREST Use medication by any route, position, wound care, and other measures to relive pain and suffering. May use oxygen, suction and manual treatment of airway obstruction as needed for comfort.      03/21/15 0059       2. Goals of Care:   comfort care, effective pain management.   Limitations on Scope of  Treatment: Full Comfort Care  Desire for further Chaplaincy support:no  Psycho-social Needs: Education on Hospice  3. Symptom Management:      1. Continue Roxicodone IR  4. Palliative Prophylaxis:   Bowel Regimen  5. Prognosis:  6-12 months  6. Discharge Planning:  Skilled Nursing Facility with Hospice   Care plan was discussed with  Patient, son Michele Lin, Dr Benjamine Mola.   Thank you for allowing the Palliative Medicine Team to assist in the care of this patient.   Time In: 1030 Time Out: 1125 Total Time 55 Prolonged Time Billed  no        [phone] Michele Hawking, Michele Lin  03/23/2015, 11:23 AM  Please contact Palliative Medicine Team phone at [phone] for questions and concerns.

## 2015-03-23 NOTE — Care Management (Signed)
Patient discharged tonight from 5W [facility]. According to PTAR drivers patient was yelling out grabbing at right hip in pain, [facility]. CM spoke with [name] RN on 5W patient was medicated prior to discharge at 7:10p. Patient discharged to ALF under hospice care [facility] contacted tonight, start of care is scheduled for tomorrow 10/25. ED CM [facility] regarding patient returning once pain is manage and was informed that the Pharmacy would note have her pain medication until tomorrow 10/25. CM updated Dr. Adriana Simasook and [place] RN. Patient will remain in the ED on Pod C until tomorrow. Will leave Hand-off to CM and CSW to follow up.

## 2015-03-23 NOTE — Discharge Summary (Signed)
Physician Discharge Summary  Michele LordsMary Lin ZOX:[id number] DOB: [birth year] DOA: 03/20/2015  PCP: Ron ParkerBOWEN,SAMUEL, MD  Admit date: 03/20/2015 Discharge date: 03/23/2015  Time spent: 35 minutes  Recommendations for Outpatient Follow-up:  1. Hospice consult at facility  Discharge Diagnoses:  Principal Problem:   AAA (abdominal aortic aneurysm) without rupture (HCC) Active Problems:   Atrial fibrillation (HCC)   Diabetes mellitus without complication (HCC)   Chronic kidney disease (CKD)   Macular degeneration   Right hip pain   Palliative care encounter   Right flank pain   Discharge Condition: improved  Diet recommendation: regular  Filed Weights   03/21/15 0524  Weight: 67.8 kg (149 lb 7.6 oz)    History of present illness:  Michele LordsMary Totman is a 81 y.o. female with Atrial Fibrillation on Xarelto Rx, DM2, AAA, and CKD who presents to the ED with complaints of 10/10 Right Sided Groin pain which was abrupt in onset this evening. She denies any history of falls, or trauma. A CT scan was performed and revealed enlargement of her AAA and failure of the AAA Graft Repair which [facility]. The AAA now measures 8.8 cm in diameter. Vascular Surgery was consulted to see her in the AM. She is hemodynamically stable and her pain improved after administration of IV Fentanyl. She was referred for medical admission.   Hospital Course:  AAA (abdominal aortic aneurysm) without rupture [facility](HCC) Non-surgical Vascular Surgery: Risk of rupture > 50% per year. No intervention or follow up planned Hospice to follow at facility  Atrial fibrillation (HCC) On Xarelto Rx  Diabetes mellitus without complication (HCC)   Chronic kidney disease (CKD)   Macular degeneration Continue Cosopt     Procedures:    Consultations:    Discharge Exam: Filed Vitals:   03/23/15 1021  BP: 87/55  Pulse: 82  Temp:   Resp:      Discharge Instructions   Discharge Instructions    Diet general    Complete by:  As directed      Increase activity slowly    Complete by:  As directed           Current Discharge Medication List    START taking these medications   Details  oxyCODONE (OXY IR/ROXICODONE) 5 MG immediate release tablet Take 1 tablet (5 mg total) by mouth every 4 (four) hours as needed for moderate pain. Qty: 30 tablet, Refills: 0      CONTINUE these medications which have NOT CHANGED   Details  acetaminophen (TYLENOL) 500 MG tablet Take 500 mg by mouth every 4 (four) hours as needed for mild pain, fever or headache.    alum & mag hydroxide-simeth (GERI-LANTA) 200-200-20 MG/5ML suspension Take 30 mLs by mouth daily as needed for indigestion or heartburn.    dorzolamide-timolol (COSOPT) 22.3-6.8 MG/ML ophthalmic solution Place 1 drop into the left eye at bedtime.    loperamide (IMODIUM) 2 MG capsule Take 2 mg by mouth as needed for diarrhea or loose stools.    loratadine (CLARITIN) 10 MG tablet Take 10 mg by mouth daily with breakfast.    magnesium hydroxide (MILK OF MAGNESIA) 400 MG/5ML suspension Take 30 mLs by mouth at bedtime as needed for mild constipation.    neomycin-bacitracin-polymyxin (NEOSPORIN) ointment Apply 1 application topically as needed for wound care.    Nutritional Supplements (NUTRITIONAL SHAKE) LIQD Take 1 Bottle by mouth daily with breakfast. *Mighty Shake*    polyethylene glycol powder (GLYCOLAX/MIRALAX) powder Take 1 Container by mouth  daily.     Rivaroxaban (XARELTO) 15 MG TABS tablet Take 15 mg by mouth at bedtime.    trospium (SANCTURA) 20 MG tablet Take 20 mg by mouth every evening.      STOP taking these medications     amoxicillin (AMOXIL) 500 MG capsule      furosemide (LASIX) 20 MG tablet      guaiFENesin  (Q-TUSSIN) 100 MG/5ML liquid      potassium chloride SA (K-DUR,KLOR-CON) 20 MEQ tablet        No Known Allergies    The results of significant diagnostics from this hospitalization (including imaging, microbiology, ancillary and laboratory) are listed below for reference.    Significant Diagnostic Studies: Ct Renal Stone Study  03/20/2015  CLINICAL DATA:  Acute right groin pain. EXAM: CT ABDOMEN AND PELVIS WITHOUT CONTRAST TECHNIQUE: Multidetector CT imaging of the abdomen and pelvis was performed following the standard protocol without IV contrast. COMPARISON:  None. FINDINGS: Visualized lung bases are unremarkable. No significant osseous abnormality is noted. No gallstones are noted. No focal abnormality is noted in the liver or spleen. Pancreas is not well visualized. Adrenal glands are not well visualized. Multiple large cysts are seen involving the kidneys. The largest measures 9 cm in diameter on the left. The patient has undergone stent graft repair of abdominal aortic aneurysm. However this is failed as the proximal end of the stent is well within the aneurysmal sac, which now has maximum measured diameter of 8.8 cm. There is no evidence of bowel obstruction. The appendix appears normal. No abnormal fluid collection is noted. Urinary bladder appears normal. Calcified uterine fibroid is noted. Sigmoid diverticulosis is noted without inflammation. 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 No significant adenopathy is noted. IMPRESSION: Large bilateral renal cysts are noted. Sigmoid diverticulosis is noted without inflammation. Failed stent graft procedure for treatment of abdominal aortic aneurysm. Aneurysmal sac is severely enlarged, with maximum measured diameter of 8.8 cm. Vascular surgery consultation recommended due to increased risk of rupture for AAA >5.5 cm. This recommendation follows ACR consensus guidelines: White Paper of the ACR Incidental Findings Committee II on  Vascular Findings. J Am Coll Radiol 2013; 10:789-794. Critical Value/emergent results were called by telephone at the time of interpretation on 03/20/2015 at 8:12 pm to Dr. Gavin Pound , who verbally acknowledged these results. Electronically Signed   By: Lupita Raider, M.D.   On: 03/20/2015 20:13   Dg Femur, Min 2 Views Right  03/20/2015  CLINICAL DATA:  Right hip pain 2 days.  No injury. EXAM: RIGHT FEMUR 2 VIEWS COMPARISON:  None. FINDINGS: There mild degenerate changes of the right hip. There is no acute fracture or dislocation. There is moderate calcified atherosclerotic disease involving iliac, femoral and popliteal arteries. IMPRESSION: No acute findings. Electronically Signed   By: Elberta Fortis M.D.   On: 03/20/2015 20:29    Microbiology: Recent Results (from the past 240 hour(s))  MRSA PCR Screening     Status: Abnormal   Collection Time: 03/21/15  7:50 AM  Result Value Ref Range Status   MRSA by PCR POSITIVE (A) NEGATIVE Final    Comment:        The GeneXpert MRSA Assay (FDA approved for NASAL specimens only), is one component of a comprehensive MRSA colonization surveillance program. It is not intended to diagnose MRSA infection nor to guide or monitor treatment for MRSA infections. CRITICAL RESULT CALLED TO, READ BACK BY AND VERIFIED WITH: Barnett Applebaum,  RN ON 03/21/15 :31 BY K MATILAINEN      Labs: Basic Metabolic Panel:  Recent Labs Lab 03/20/15 1900 03/21/15 0346  NA 136 141  K 4.2 4.3  CL 97* 101  CO2 31 27  GLUCOSE 122* 99  BUN 28* 28*  CREATININE 1.24* 1.21*  CALCIUM 9.2 9.3   Liver Function Tests:  Recent Labs Lab 03/20/15 1900  AST 32  ALT 21  ALKPHOS 119  BILITOT 1.2  PROT 6.6  ALBUMIN 3.1*   No results for input(s): LIPASE, AMYLASE in the last 168 hours. No results for input(s): AMMONIA in the last 168 hours. CBC:  Recent Labs Lab 03/20/15 1900 03/21/15 0346  WBC 7.6 8.7  NEUTROABS 4.9  --   HGB 15.0 14.7  HCT 45.5 44.3  MCV  91.2 91.7  PLT 207 165   Cardiac Enzymes: No results for input(s): CKTOTAL, CKMB, CKMBINDEX, TROPONINI in the last 168 hours. BNP: BNP (last 3 results) No results for input(s): BNP in the last 8760 hours.  ProBNP (last 3 results) No results for input(s): PROBNP in the last 8760 hours.  CBG:  Recent Labs Lab 03/22/15 1142 03/22/15 1649 03/22/15 2159 03/23/15 0721 03/23/15 1219  GLUCAP 161* 100* 101* 105* 183*       Signed:  Rod Majerus  [facility] 03/23/2015, 1:59 PM

## 2015-03-23 NOTE — Progress Notes (Signed)
NURSING PROGRESS NOTE  Michele LordsMary Lohr [id number] Discharge Data: 03/23/2015 7:37 PM Attending Provider: No att. providers found JXB:JYNWG,NFAOZHPCP:BOWEN,SAMUEL, MD     Michele LordsMary Jons to be D/C'd Skilled nursing facility per MD order.  Discussed with the patient the After Visit Summary and all questions fully answered. All IV's discontinued with no bleeding noted. All belongings returned to patient for patient to take to [facility]. Report called to nurse at facility. All questions answered.  Last Vital Signs:  Blood pressure 94/76, pulse 86, temperature 98.5 F (36.9 C), temperature source Oral, resp. rate 20, height 5\' 6"  (1.676 m), weight 67.8 kg (149 lb 7.6 oz), SpO2 97 %.  Discharge Medication List   Medication List    STOP taking these medications        amoxicillin 500 MG capsule  Commonly known as:  AMOXIL     furosemide 20 MG tablet  Commonly known as:  LASIX     potassium chloride SA 20 MEQ tablet  Commonly known as:  K-DUR,KLOR-CON     Q-TUSSIN 100 MG/5ML liquid  Generic drug:  guaiFENesin      TAKE these medications        acetaminophen 500 MG tablet  Commonly known as:  TYLENOL  Take 500 mg by mouth every 4 (four) hours as needed for mild pain, fever or headache.     dorzolamide-timolol 22.3-6.8 MG/ML ophthalmic solution  Commonly known as:  COSOPT  Place 1 drop into the left eye at bedtime.     GERI-LANTA 200-200-20 MG/5ML suspension  Generic drug:  alum & mag hydroxide-simeth  Take 30 mLs by mouth daily as needed for indigestion or heartburn.     loperamide 2 MG capsule  Commonly known as:  IMODIUM  Take 2 mg by mouth as needed for diarrhea or loose stools.     loratadine 10 MG tablet  Commonly known as:  CLARITIN  Take 10 mg by mouth daily with breakfast.     magnesium hydroxide 400 MG/5ML suspension  Commonly known as:  MILK OF MAGNESIA  Take 30 mLs by mouth at bedtime as needed for mild constipation.     neomycin-bacitracin-polymyxin ointment   Commonly known as:  NEOSPORIN  Apply 1 application topically as needed for wound care.     NUTRITIONAL SHAKE Liqd  Take 1 Bottle by mouth daily with breakfast. *Mighty Shake*     oxyCODONE 5 MG immediate release tablet  Commonly known as:  Oxy IR/ROXICODONE  Take 1 tablet (5 mg total) by mouth every 4 (four) hours as needed for moderate pain.     polyethylene glycol powder powder  Commonly known as:  GLYCOLAX/MIRALAX  Take 1 Container by mouth daily.     Rivaroxaban 15 MG Tabs tablet  Commonly known as:  XARELTO  Take 15 mg by mouth at bedtime.     trospium 20 MG tablet  Commonly known as:  SANCTURA  Take 20 mg by mouth every evening.         Bennie Pieriniyndi Marlan Steward, RN

## 2015-03-23 NOTE — Clinical Social Work Note (Signed)
CSW notified by floor today that the patient is [facility] and ready to DC today. CSW spoke with patient's son Loraine LericheMark who agrees with this plan and would like hospice services to follow the patient. CSW unable to complete full assessment with patient and son.   Per MD patient ready to DC [facility] with hosp[facility] services. RN, patient/family (son Loraine LericheMark at bedside), and facility notified of patient's DC [facility] Almas([facility] has approved the patient to return). RN given number for report. DC packet on patient's chart. Ambulance transport requested for patient. CSW signing off at this time.   Roddie McBryant Halsey Persaud MSW, ByersLCSW, [place], [phone]

## 2015-03-23 NOTE — Progress Notes (Signed)
Late entry for missed G-code. Based on review of the evaluation and goals by Michail JewelsAshley Parr, PT   03/21/15 1541  PT G-Codes **NOT FOR INPATIENT CLASS**  Functional Assessment Tool Used Clinical judgement based on review of evaluation and goals   Functional Limitation Mobility: Walking and moving around  Mobility: Walking and Moving Around Current Status ([id number](G8978) CK  Mobility: Walking and Moving Around Goal Status [phone]([id number]) Cristy FolksJ  Aviannah Castoro, PT  [phone] 03/23/2015

## 2015-03-23 NOTE — Progress Notes (Signed)
CSW called pt's son, Loraine LericheMark, re: pt's admission to the ED en route to [facility].  No answer.  VM message left with CSW/ED call-back numbers left with message.  Pt to remain in ED overnight so that [facility] can get her pain med[facility].  Dayshift CSW will be made aware and will assist with facilitating ALF tx.  Pollyann SavoyJody Damean Poffenberger, LCSW Evening/ED CSW [phone]

## 2015-03-24 NOTE — ED Notes (Signed)
Pt has been rolling onto bedpan w/o any difficulty this shift w/1 staff member assist. After pt was advised she is going to be transported back [facility], pt now states she is not able to roll/move d/t severe pain.

## 2015-03-24 NOTE — Progress Notes (Signed)
LCS[facility] spoke with [facility] regarding patient returning today. Medicine has still not been delivered, but facility guarantees medicine by 3:00pm today.  Patient can be discharged from ED with medicine at facility today at 3pm.  Patient will transfer by PTAR back to [facility] today at 3pm.  Deretha EmoryHannah Sankalp Ferrell LCSW, MSW Clinical Social Work: Emergency Room [phone]

## 2015-03-24 NOTE — ED Notes (Signed)
Pt states she does not want to [facility] back [facility]. States she wants to stay here. Advised pt of tx plan - unable to stay in ED. PTAR given paperwork and yellow DNR form.

## 2015-03-24 NOTE — ED Notes (Signed)
Patient was given jello and a drink, Regular diet order taken.

## 2015-04-30 DEATH — deceased

## 2016-04-06 IMAGING — CT CT RENAL STONE PROTOCOL
2 of 4 series · 15 of 46 positions shown, 17 images · non-contrast
Comparison: None.

CLINICAL DATA: Acute right groin pain.

EXAM:
CT ABDOMEN AND PELVIS WITHOUT CONTRAST
TECHNIQUE: Multidetector CT imaging of the abdomen and pelvis was performed
following the standard protocol without IV contrast.

[Series 2: renal stone 5mm · axial · 0.81mm/px · z∈[+939,+1359]mm · 12 of 94 slices shown, 14 images]
[im 5/94  soft-tissue]
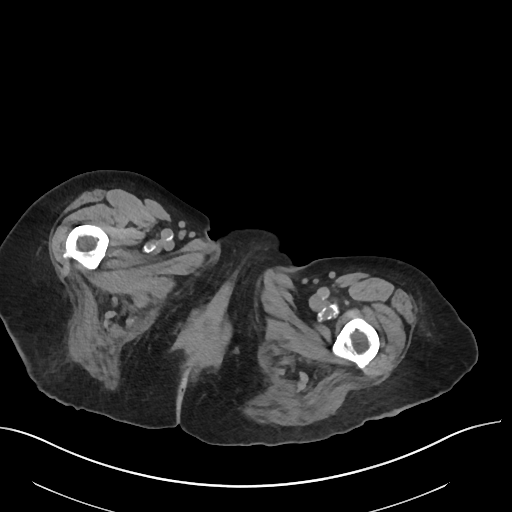
[im 5/94  bone]
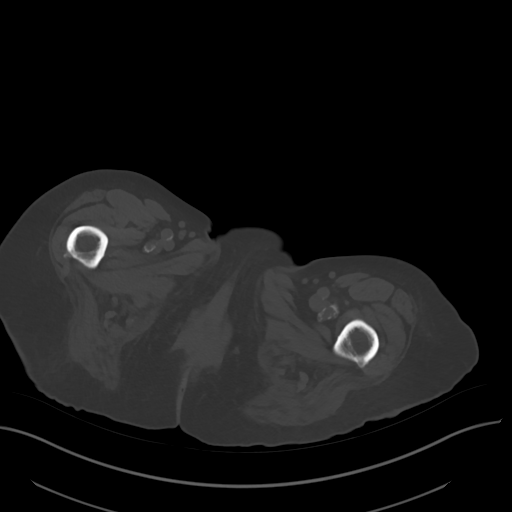
[im 13/94  soft-tissue]
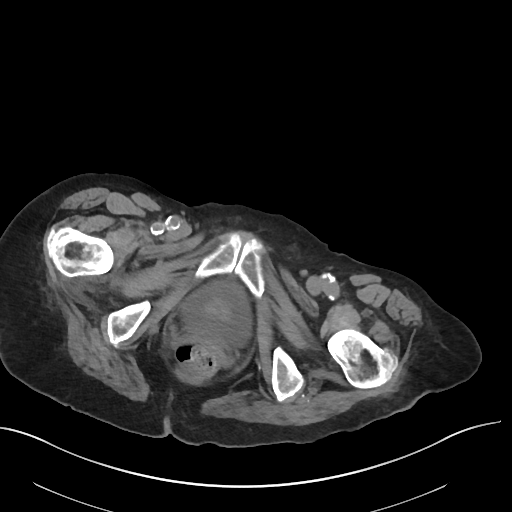
[im 21/94  soft-tissue]
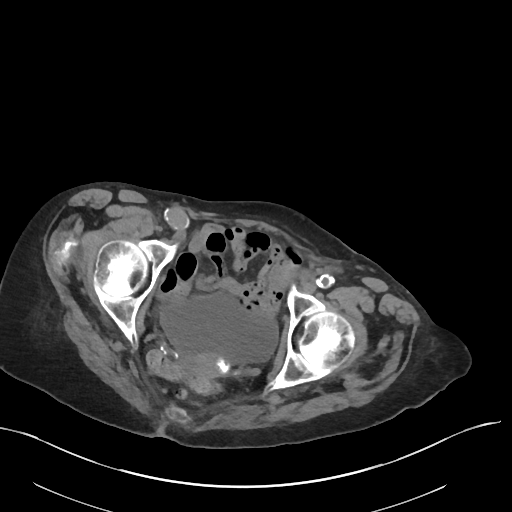
[im 29/94  soft-tissue]
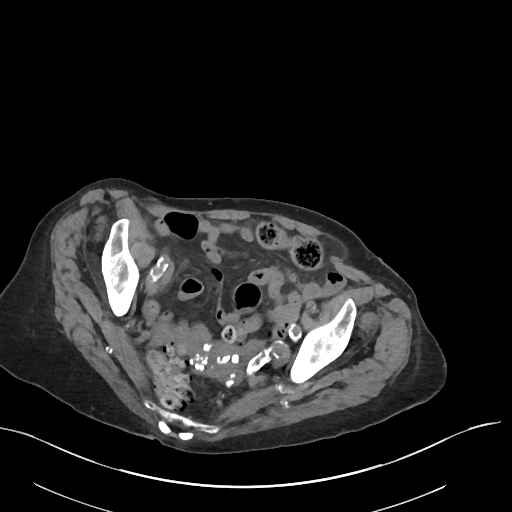
[im 37/94  soft-tissue]
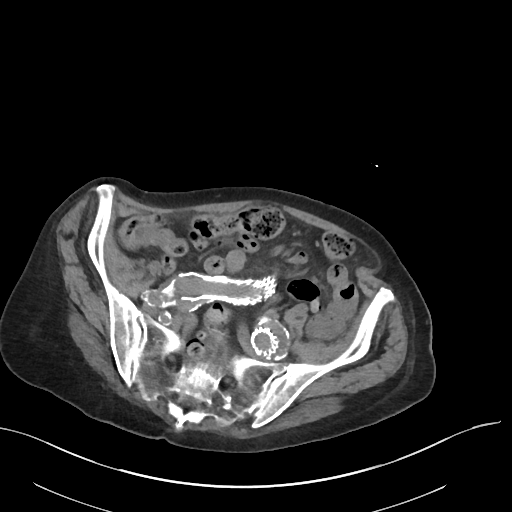
[im 45/94  soft-tissue]
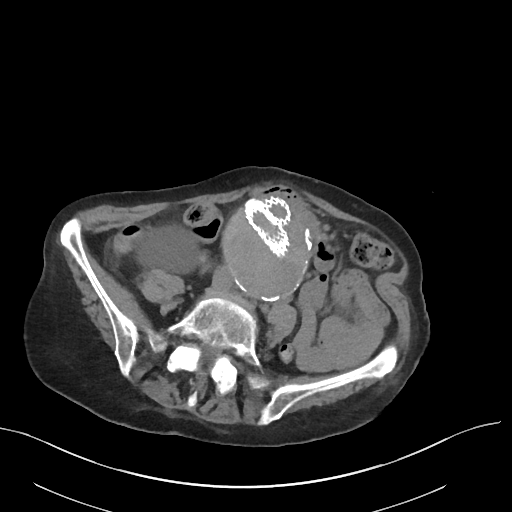
[im 49/94  soft-tissue]
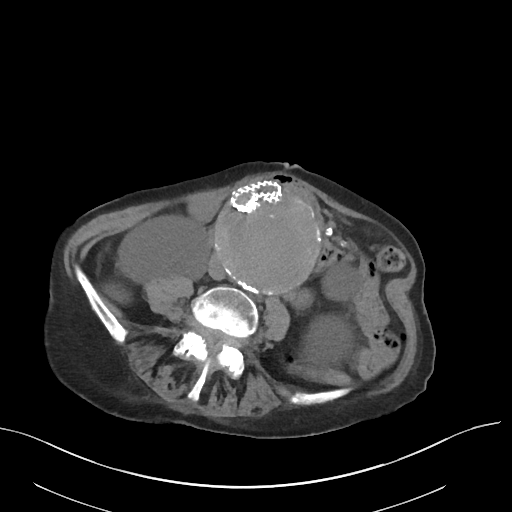
[im 57/94  soft-tissue]
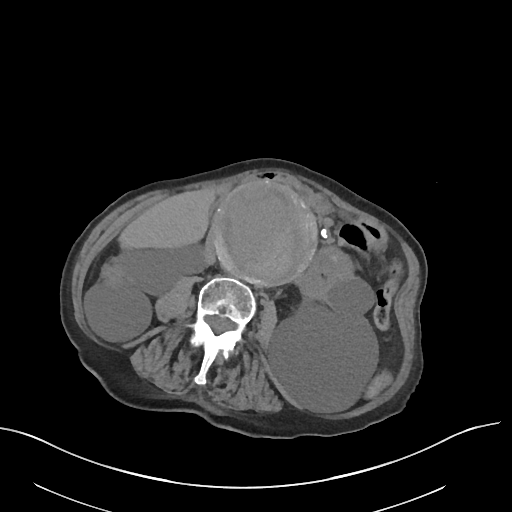
[im 65/94  soft-tissue]
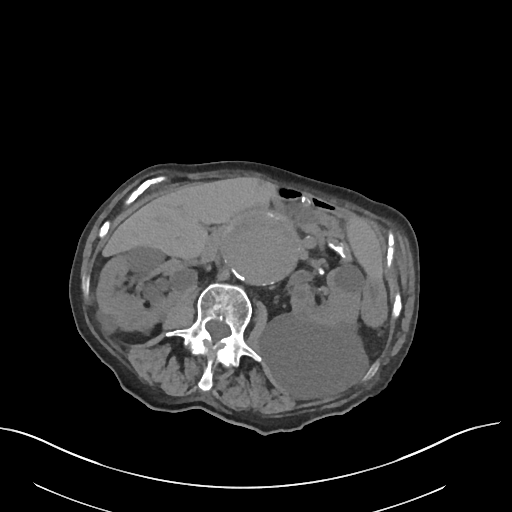
[im 65/94  bone]
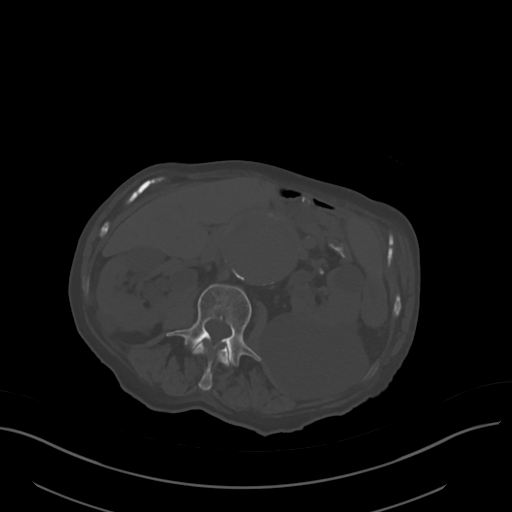
[im 73/94  soft-tissue]
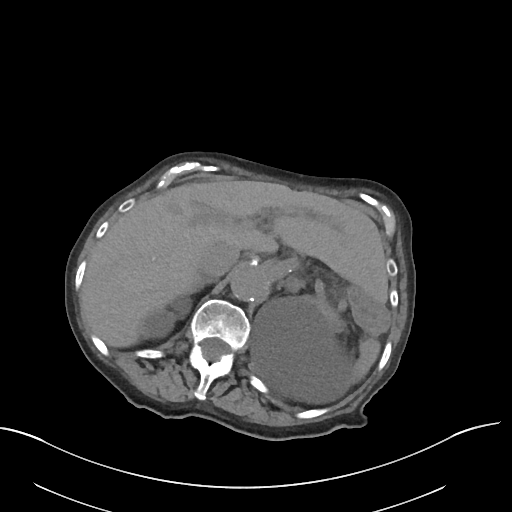
[im 81/94  soft-tissue]
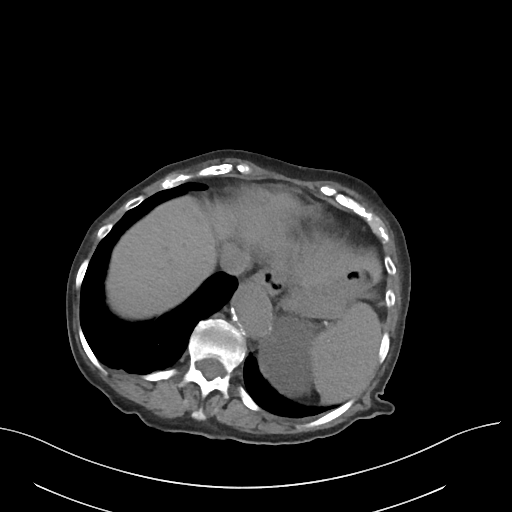
[im 89/94  soft-tissue]
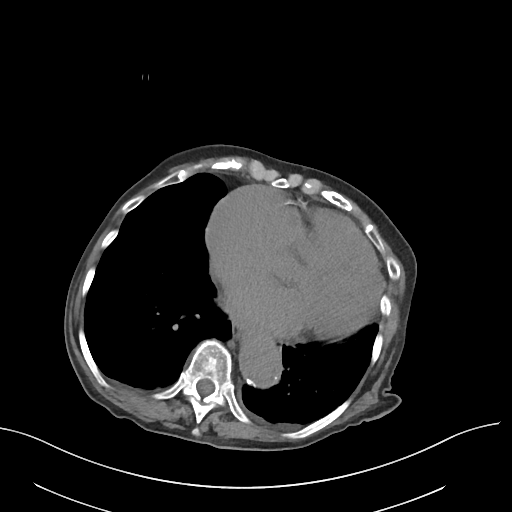

[Series 4: renal stone 3.0 cor · coronal · 0.59mm/px · 3 of 88 slices shown]
[im 30/88  soft-tissue]
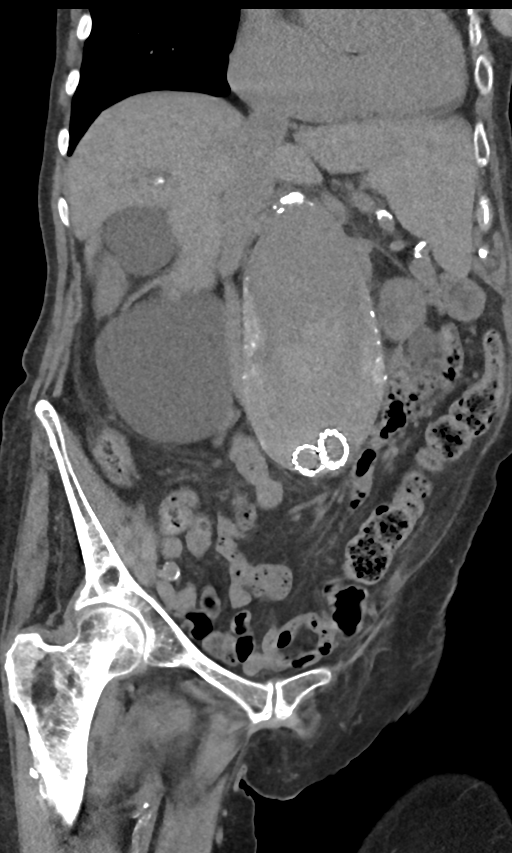
[im 39/88  soft-tissue]
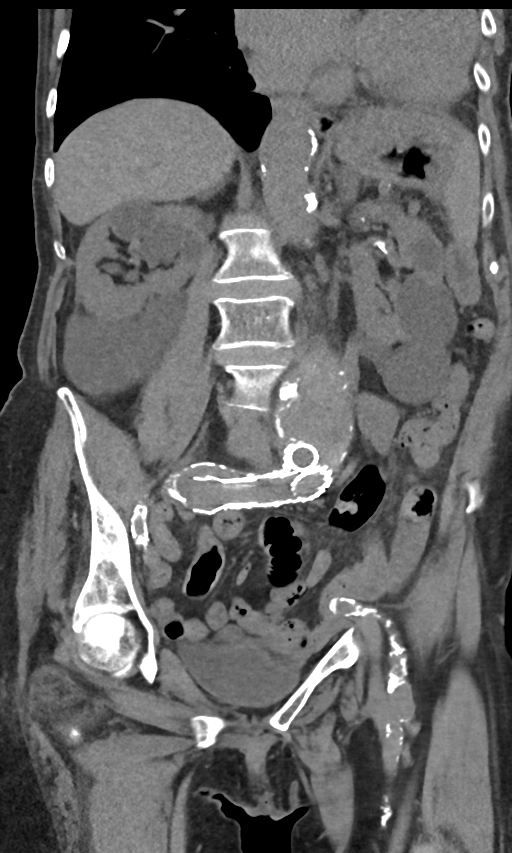
[im 49/88  soft-tissue]
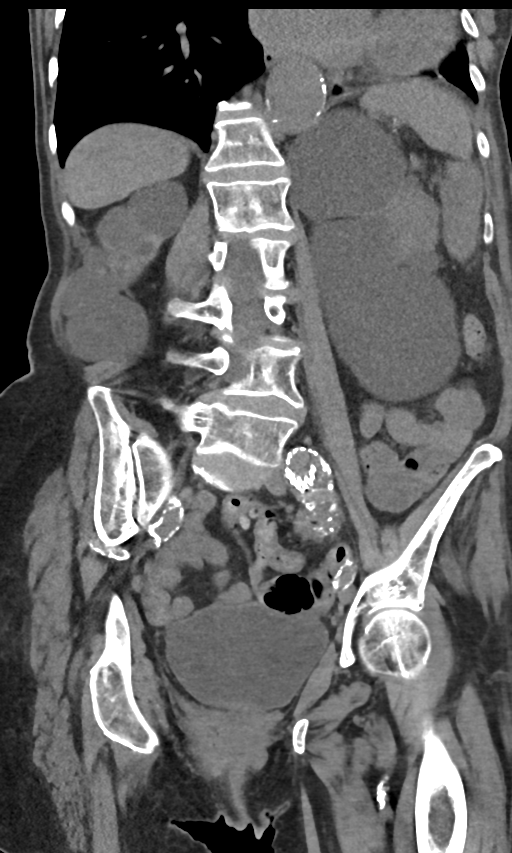

[15 of 46 positions shown; findings below may reference images not displayed]

FINDINGS: Visualized lung bases are unremarkable. No significant osseous
abnormality is noted.

No gallstones are noted. No focal abnormality is noted in the liver
or spleen. Pancreas is not well visualized. Adrenal glands are not
well visualized. Multiple large cysts are seen involving the
kidneys. The largest measures 9 cm in diameter on the left. The
patient has undergone stent graft repair of abdominal aortic
aneurysm. However this is failed as the proximal end of the stent is
well within the aneurysmal sac, which now has maximum measured
diameter of 8.8 cm. There is no evidence of bowel obstruction. The
appendix appears normal. No abnormal fluid collection is noted.
Urinary bladder appears normal. Calcified uterine fibroid is noted.
Sigmoid diverticulosis is noted without inflammation. 3.3 cm
aneurysm of distal left common iliac artery is noted. No significant
adenopathy is noted.
IMPRESSION: Large bilateral renal cysts are noted.

Sigmoid diverticulosis is noted without inflammation.

Failed stent graft procedure for treatment of abdominal aortic
aneurysm. Aneurysmal sac is severely enlarged, with maximum measured
diameter of 8.8 cm. Vascular surgery consultation recommended due to
increased risk of rupture for AAA >5.5 cm. This recommendation
follows ACR consensus guidelines: White Paper of the ACR Incidental
Findings Committee II on Vascular Findings. [HOSPITAL] 7779;
[DATE]. Critical Value/emergent results were called by telephone
at the time of interpretation on 03/20/2015 at [DATE] to Dr. LUQMAN
CABALIN , who verbally acknowledged these results.

## 2016-04-06 IMAGING — CR DG FEMUR 2+V*R*
4 series · 4 of 4 positions shown · non-contrast
Comparison: None.

CLINICAL DATA: Right hip pain 2 days.  No injury.

EXAM:
RIGHT FEMUR 2 VIEWS

[femur ap (1 of 2)]
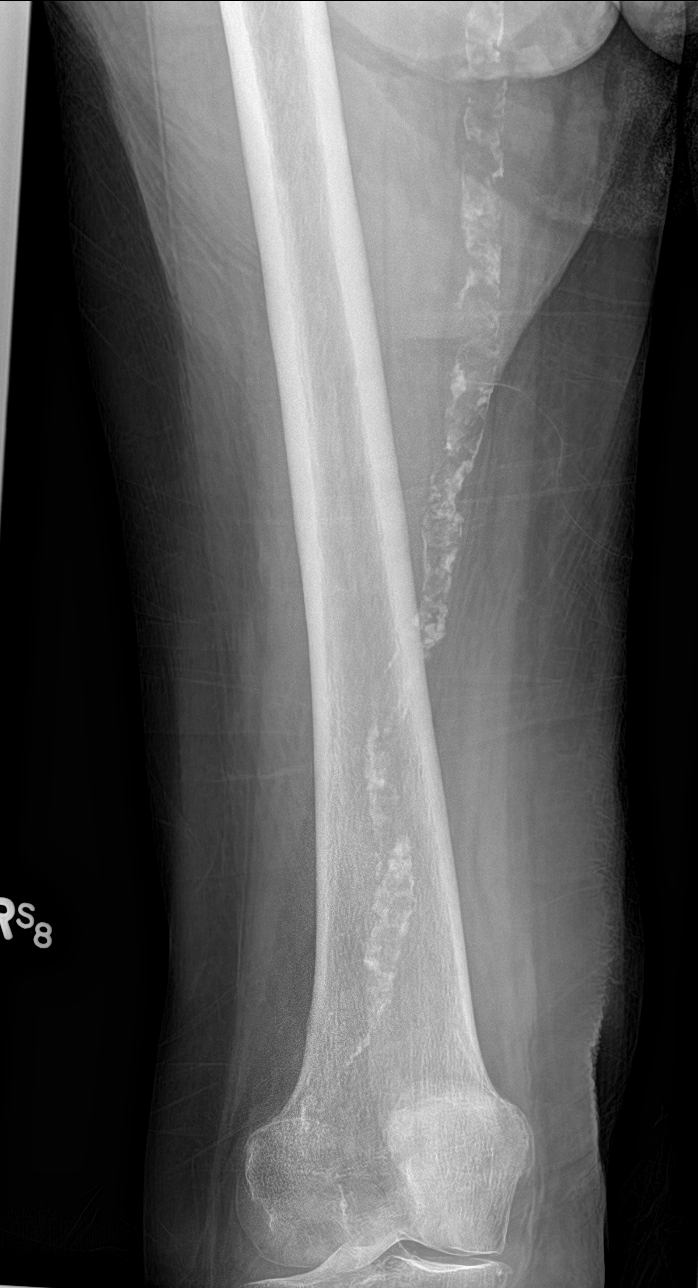

[femur ap (2 of 2)]
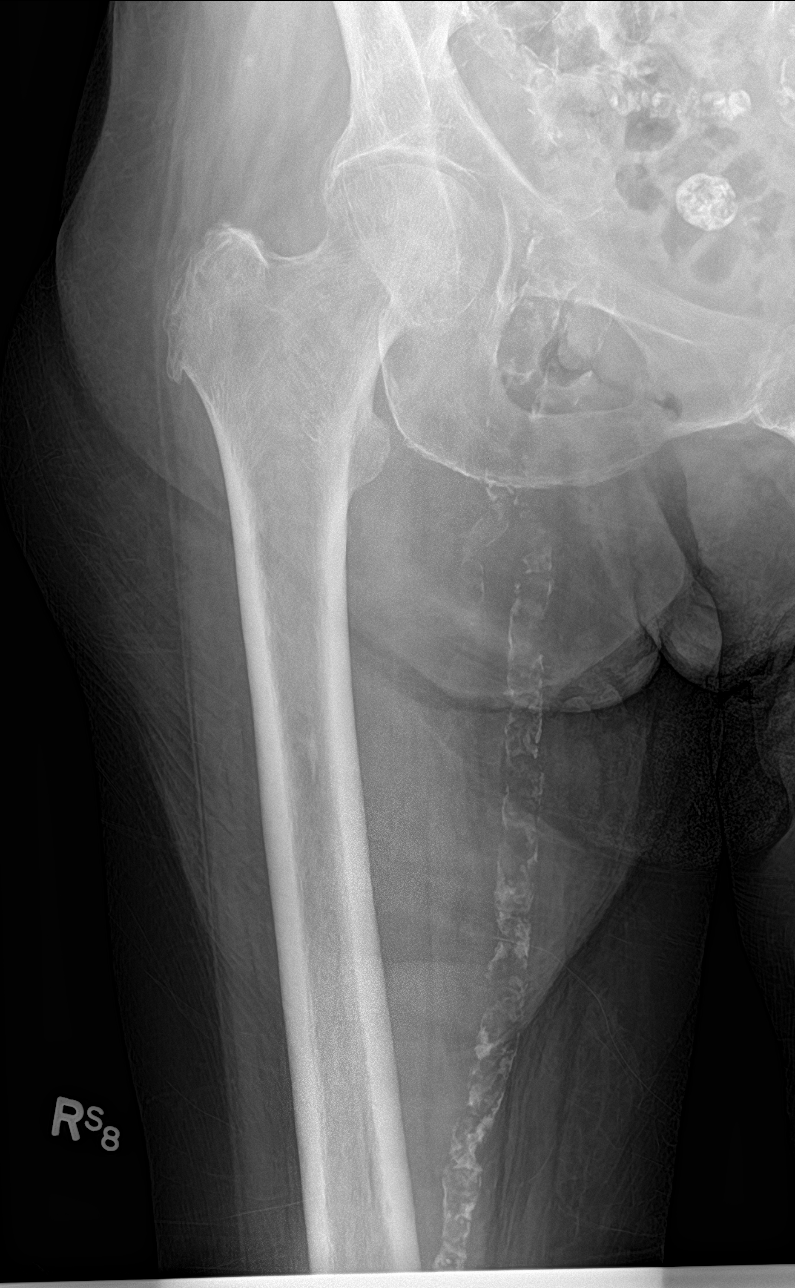

[femur lat (1 of 2)]
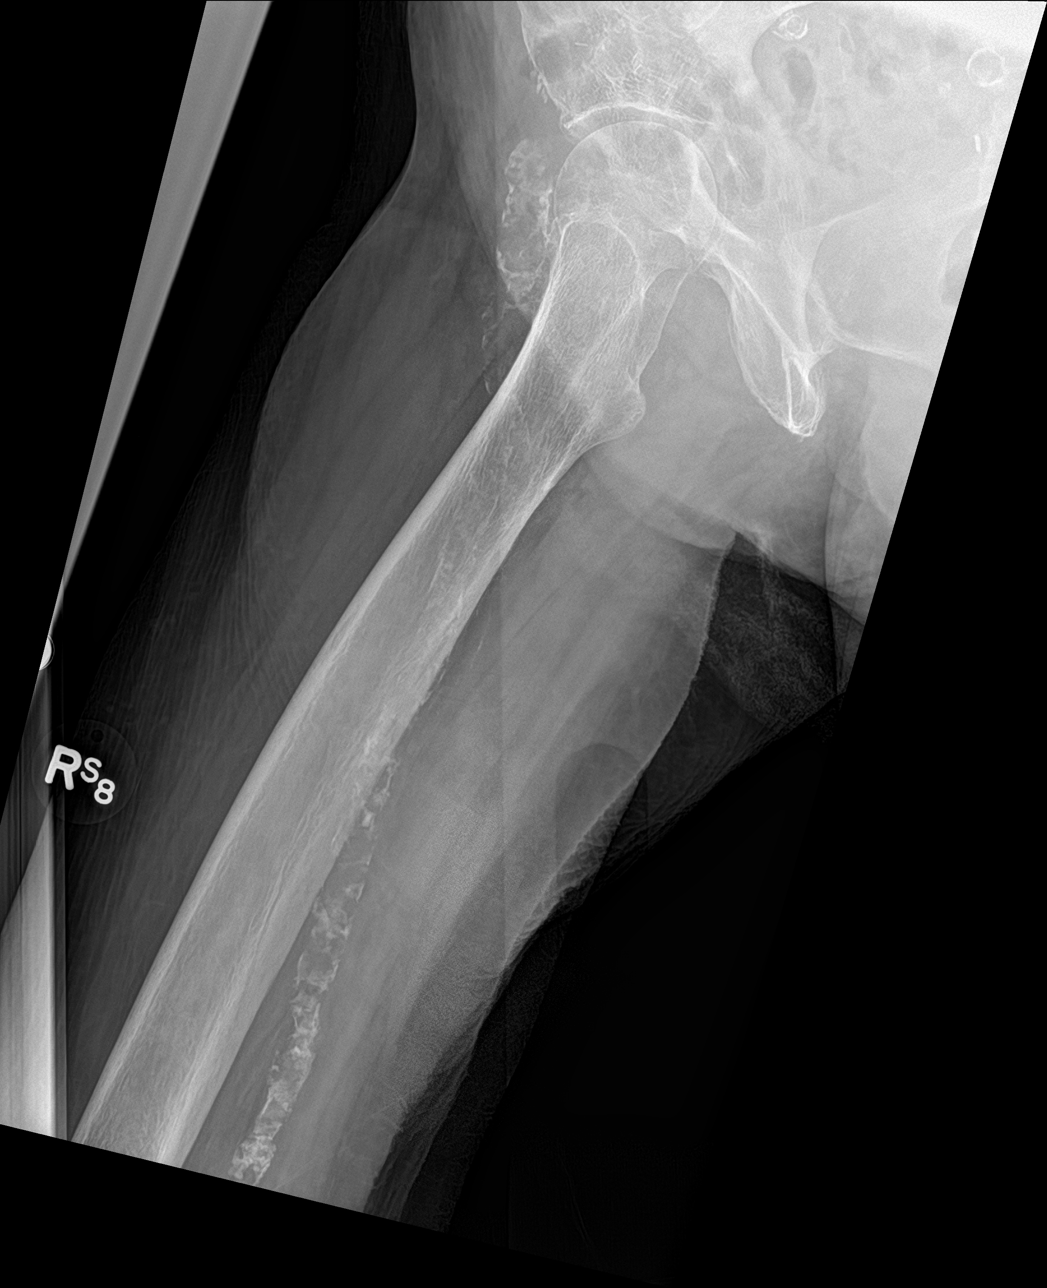

[femur lat (2 of 2)]
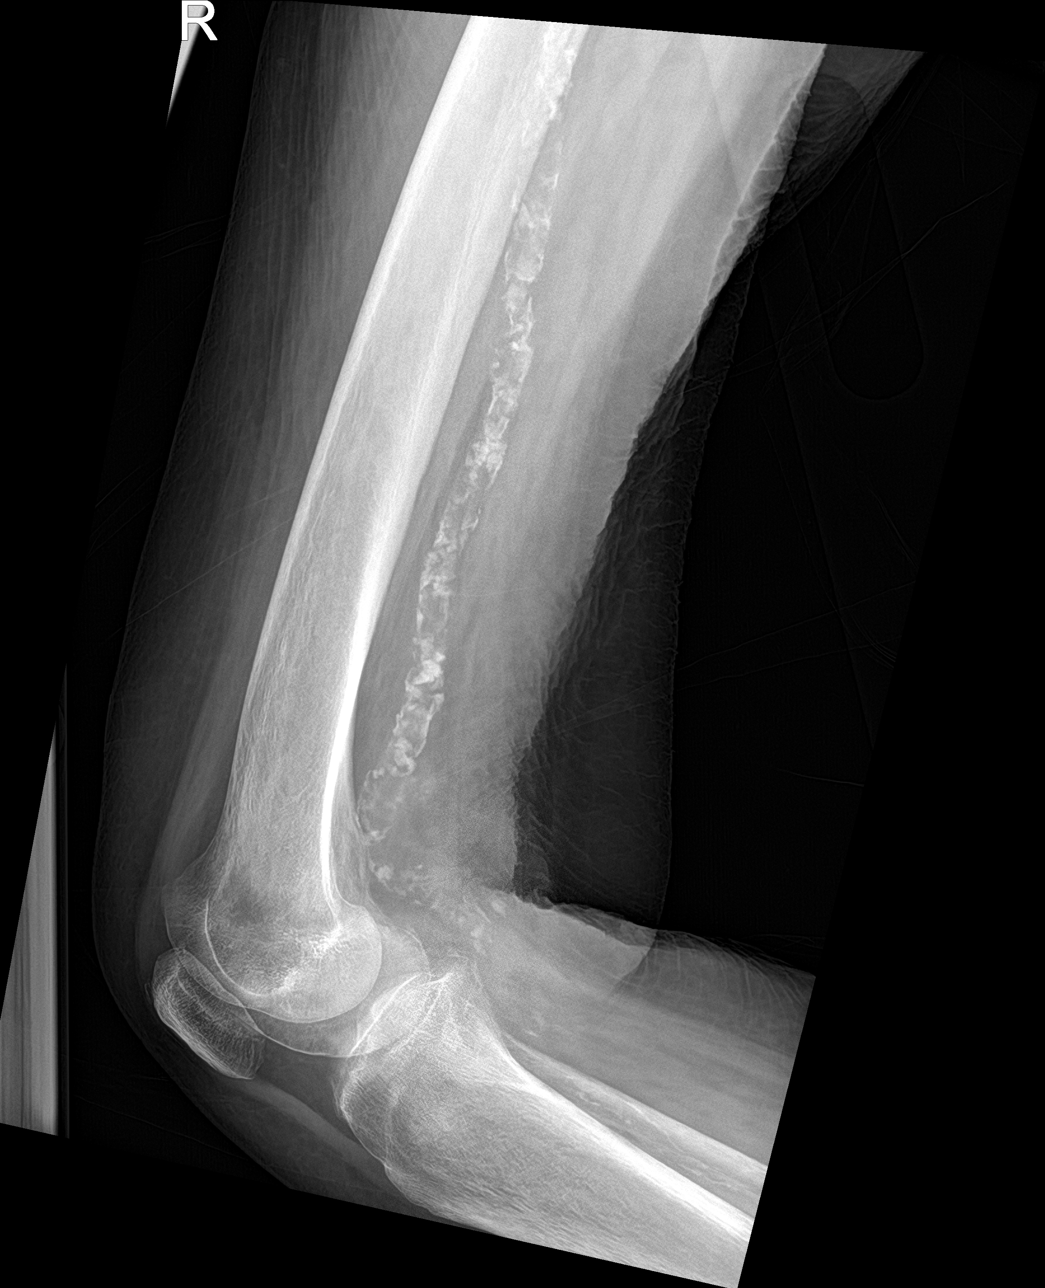

[4 of 4 positions shown; findings below may reference images not displayed]

FINDINGS: There mild degenerate changes of the right hip. There is no acute
fracture or dislocation. There is moderate calcified atherosclerotic
disease involving iliac, femoral and popliteal arteries.
IMPRESSION: No acute findings.
